# Patient Record
Sex: Female | Born: 1950 | Race: White | Hispanic: No | Smoking: Never smoker
Health system: Southern US, Community
[De-identification: ages and names within clinical notes are randomized; demographics above are authoritative.]

## PROBLEM LIST (undated history)

## (undated) DIAGNOSIS — K219 Gastro-esophageal reflux disease without esophagitis: Secondary | ICD-10-CM

## (undated) DIAGNOSIS — E559 Vitamin D deficiency, unspecified: Secondary | ICD-10-CM

## (undated) DIAGNOSIS — M810 Age-related osteoporosis without current pathological fracture: Secondary | ICD-10-CM

## (undated) DIAGNOSIS — S42209A Unspecified fracture of upper end of unspecified humerus, initial encounter for closed fracture: Secondary | ICD-10-CM

## (undated) HISTORY — PX: NO PAST SURGERIES: SHX2092

## (undated) HISTORY — PX: BREAST BIOPSY: SHX20

---

## 2001-12-07 ENCOUNTER — Encounter: Payer: Self-pay | Admitting: Emergency Medicine

## 2001-12-07 ENCOUNTER — Emergency Department (HOSPITAL_COMMUNITY): Admission: EM | Admit: 2001-12-07 | Discharge: 2001-12-07 | Payer: Self-pay | Admitting: Emergency Medicine

## 2014-01-24 ENCOUNTER — Emergency Department (HOSPITAL_BASED_OUTPATIENT_CLINIC_OR_DEPARTMENT_OTHER)
Admission: EM | Admit: 2014-01-24 | Discharge: 2014-01-24 | Disposition: A | Discharge status: Home / Self Care | Payer: No Typology Code available for payment source | Attending: Emergency Medicine | Admitting: Emergency Medicine

## 2014-01-24 ENCOUNTER — Encounter (HOSPITAL_BASED_OUTPATIENT_CLINIC_OR_DEPARTMENT_OTHER): Payer: Self-pay | Admitting: *Deleted

## 2014-01-24 ENCOUNTER — Emergency Department (HOSPITAL_BASED_OUTPATIENT_CLINIC_OR_DEPARTMENT_OTHER): Payer: No Typology Code available for payment source

## 2014-01-24 DIAGNOSIS — Y92010 Kitchen of single-family (private) house as the place of occurrence of the external cause: Secondary | ICD-10-CM | POA: Diagnosis not present

## 2014-01-24 DIAGNOSIS — Y9389 Activity, other specified: Secondary | ICD-10-CM | POA: Diagnosis not present

## 2014-01-24 DIAGNOSIS — W19XXXA Unspecified fall, initial encounter: Secondary | ICD-10-CM

## 2014-01-24 DIAGNOSIS — S6991XA Unspecified injury of right wrist, hand and finger(s), initial encounter: Secondary | ICD-10-CM | POA: Diagnosis not present

## 2014-01-24 DIAGNOSIS — W01198A Fall on same level from slipping, tripping and stumbling with subsequent striking against other object, initial encounter: Secondary | ICD-10-CM | POA: Diagnosis not present

## 2014-01-24 DIAGNOSIS — S59901A Unspecified injury of right elbow, initial encounter: Secondary | ICD-10-CM | POA: Insufficient documentation

## 2014-01-24 DIAGNOSIS — S42201A Unspecified fracture of upper end of right humerus, initial encounter for closed fracture: Secondary | ICD-10-CM

## 2014-01-24 DIAGNOSIS — S4991XA Unspecified injury of right shoulder and upper arm, initial encounter: Secondary | ICD-10-CM | POA: Diagnosis present

## 2014-01-24 DIAGNOSIS — Y998 Other external cause status: Secondary | ICD-10-CM | POA: Diagnosis not present

## 2014-01-24 DIAGNOSIS — S42291A Other displaced fracture of upper end of right humerus, initial encounter for closed fracture: Secondary | ICD-10-CM | POA: Diagnosis not present

## 2014-01-24 MED ORDER — OXYCODONE-ACETAMINOPHEN 5-325 MG PO TABS
1.0000 | ORAL_TABLET | ORAL | Status: DC | PRN
Start: 2014-01-24 — End: 2014-01-27

## 2014-01-24 NOTE — ED Notes (Signed)
C/o right shoulder pain- reports she tripped over dishwasher and landed on shoulder

## 2014-01-24 NOTE — ED Provider Notes (Signed)
CSN: [id number]     Arrival date & time 01/24/14  2031 History  This chart was scribed for Gilda Creasehristopher J. Cristiano Capri, * by Roxy Cedarhandni Bhalodia, ED Scribe. This patient was seen in room MH04/MH04 and the patient's care was started at 8:56 PM.   Chief Complaint  Patient presents with  . Shoulder Injury   Patient is a 63 y.o. female presenting with shoulder injury. The history is provided by the patient. No language interpreter was used.  Shoulder Injury   HPI Comments: Lorraine Hammond is a 63 y.o. female who presents to the Emergency Department complaining of right shoulder pain that began earlier today when patient tripped over dishwasher and landed on her shoulder. She reports associated pain to right elbow and wrist. She states that she is ambulatory and denies any other associated pain.  History reviewed. No pertinent past medical history. History reviewed. No pertinent past surgical history. No family history on file. History  Substance Use Topics  . Smoking status: Never Smoker   . Smokeless tobacco: Never Used  . Alcohol Use: Yes     Comment: 2 drinks/week   OB History    No data available     Review of Systems  Musculoskeletal: Positive for arthralgias.       Right shoulder pain.  All other systems reviewed and are negative.  Allergies  Tetracyclines & related  Home Medications   Prior to Admission medications   Not on File   Triage Vitals: BP 176/71 mmHg  Pulse 72  Temp(Src) 98.5 F (36.9 C) (Oral)  Resp 20  Ht 5\' 3"  (1.6 m)  Wt 190 lb (86.183 kg)  BMI 33.67 kg/m2  SpO2 99%  Physical Exam  Constitutional: She is oriented to person, place, and time. She appears well-developed and well-nourished. No distress.  HENT:  Head: Normocephalic and atraumatic.  Right Ear: Hearing normal.  Left Ear: Hearing normal.  Nose: Nose normal.  Mouth/Throat: Oropharynx is clear and moist and mucous membranes are normal.  Eyes: Conjunctivae and EOM are normal. Pupils are equal,  round, and reactive to light.  Neck: Normal range of motion. Neck supple.  Cardiovascular: Regular rhythm, S1 normal and S2 normal.  Exam reveals no gallop and no friction rub.   No murmur heard. Pulmonary/Chest: Effort normal and breath sounds normal. No respiratory distress. She exhibits no tenderness.  Abdominal: Soft. Normal appearance and bowel sounds are normal. There is no hepatosplenomegaly. There is no tenderness. There is no rebound, no guarding, no tenderness at McBurney's point and negative Murphy's sign. No hernia.  Musculoskeletal: Normal range of motion. She exhibits tenderness.  Proximal tenderness of the humerus. Mild tenderness to elbow and wrist. No swelling or erythema.  Neurological: She is alert and oriented to person, place, and time. She has normal strength. No cranial nerve deficit or sensory deficit. Coordination normal. GCS eye subscore is 4. GCS verbal subscore is 5. GCS motor subscore is 6.  Skin: Skin is warm, dry and intact. No rash noted. No cyanosis.  Psychiatric: She has a normal mood and affect. Her speech is normal and behavior is normal. Thought content normal.  Nursing note and vitals reviewed.  ED Course  Procedures (including critical care time)  DIAGNOSTIC STUDIES: Oxygen Saturation is 99% on RA, normal by my interpretation.    COORDINATION OF CARE: 9:00 PM- Discussed plans to order diagnostic imaging of right shoulder, elbow and wrist. Pt advised of plan for treatment and pt agrees.  Labs Review Labs Reviewed -  No data to display  Imaging Review No results found.   EKG Interpretation None     MDM   Final diagnoses:  None  proximal humerus fracture  Patient presents to the ER for evaluation of right shoulder injury after a fall. Patient tripped over her dishwasher door and fell, landing on her right side. Patient has pain in the proximal humerus area and decreased range of motion of the shoulder. She had mild pain in the elbow and wrist  area as well, no deformity noted. X-rays of these areas revealed comminuted proximal humerus fracture. Patient did not have any evidence of head injury. No headache she did not lose consciousness. No neck or back pain or tenderness. Patient had no chest wall tenderness or abdominal tenderness.  Treated with sling, analgesia, follow up with orthopedics.  I personally performed the services described in this documentation, which was scribed in my presence. The recorded information has been reviewed and is accurate.  Gilda Creasehristopher J. Brookelle Pellicane, MD 01/24/14 2157

## 2014-01-24 NOTE — Discharge Instructions (Signed)
Humerus Fracture, Treated with Immobilization  The humerus is the large bone in your upper arm. You have a broken (fractured) humerus. These fractures are easily diagnosed with X-rays.  TREATMENT   Simple fractures which will heal without disability are treated with simple immobilization. Immobilization means you will wear a cast, splint, or sling. You have a fracture which will do well with immobilization. The fracture will heal well simply by being held in a good position until it is stable enough to begin range of motion exercises. Do not take part in activities which would further injure your arm.   HOME CARE INSTRUCTIONS    Put ice on the injured area.   Put ice in a plastic bag.   Place a towel between your skin and the bag.   Leave the ice on for 15-20 minutes, 03-04 times a day.   If you have a cast:   Do not scratch the skin under the cast using sharp or pointed objects.   Check the skin around the cast every day. You may put lotion on any red or sore areas.   Keep your cast dry and clean.   If you have a splint:   Wear the splint as directed.   Keep your splint dry and clean.   You may loosen the elastic around the splint if your fingers become numb, tingle, or turn cold or blue.   If you have a sling:   Wear the sling as directed.   Do not put pressure on any part of your cast or splint until it is fully hardened.   Your cast or splint can be protected during bathing with a plastic bag. Do not lower the cast or splint into water.   Only take over-the-counter or prescription medicines for pain, discomfort, or fever as directed by your caregiver.   Do range of motion exercises as instructed by your caregiver.   Follow up as directed by your caregiver. This is very important in order to avoid permanent injury or disability and chronic pain.  SEEK IMMEDIATE MEDICAL CARE IF:    Your skin or nails in the injured arm turn blue or gray.   Your arm feels cold or numb.   You develop severe  pain in the injured arm.   You are having problems with the medicines you were given.  MAKE SURE YOU:    Understand these instructions.   Will watch your condition.   Will get help right away if you are not doing well or get worse.  Document Released: 06/11/2000 Document Revised: 05/28/2011 Document Reviewed: 04/19/2010  ExitCare Patient Information 2015 [employer]. This information is not intended to replace advice given to you by your health care provider. Make sure you discuss any questions you have with your health care provider.

## 2014-01-25 ENCOUNTER — Encounter (HOSPITAL_COMMUNITY): Payer: Self-pay | Admitting: *Deleted

## 2014-01-25 NOTE — Progress Notes (Signed)
Pt denies SOB, chest pain, and being under the care of a cardiologist. Pt denies having an EKG and chest x ray within the last year. Pt denies having a stress test, echo, and cardiac cath. Pt has no PCP at this time.

## 2014-01-26 ENCOUNTER — Encounter (HOSPITAL_COMMUNITY)
Admission: RE | Disposition: A | Discharge status: Home / Self Care | Payer: Self-pay | Source: Ambulatory Visit | Attending: Orthopedic Surgery

## 2014-01-26 ENCOUNTER — Ambulatory Visit (HOSPITAL_COMMUNITY)
Admission: RE | Admit: 2014-01-26 | Discharge: 2014-01-27 | Disposition: A | Discharge status: Home / Self Care | Payer: No Typology Code available for payment source | Source: Ambulatory Visit | Attending: Orthopedic Surgery | Admitting: Orthopedic Surgery

## 2014-01-26 ENCOUNTER — Ambulatory Visit (HOSPITAL_COMMUNITY): Payer: No Typology Code available for payment source

## 2014-01-26 ENCOUNTER — Encounter (HOSPITAL_COMMUNITY): Payer: Self-pay | Admitting: *Deleted

## 2014-01-26 ENCOUNTER — Ambulatory Visit (HOSPITAL_COMMUNITY): Payer: No Typology Code available for payment source | Admitting: Anesthesiology

## 2014-01-26 DIAGNOSIS — Z01818 Encounter for other preprocedural examination: Secondary | ICD-10-CM

## 2014-01-26 DIAGNOSIS — Y92009 Unspecified place in unspecified non-institutional (private) residence as the place of occurrence of the external cause: Secondary | ICD-10-CM | POA: Diagnosis not present

## 2014-01-26 DIAGNOSIS — Z6833 Body mass index (BMI) 33.0-33.9, adult: Secondary | ICD-10-CM | POA: Insufficient documentation

## 2014-01-26 DIAGNOSIS — E559 Vitamin D deficiency, unspecified: Secondary | ICD-10-CM | POA: Diagnosis present

## 2014-01-26 DIAGNOSIS — S42251A Displaced fracture of greater tuberosity of right humerus, initial encounter for closed fracture: Secondary | ICD-10-CM | POA: Insufficient documentation

## 2014-01-26 DIAGNOSIS — W010XXA Fall on same level from slipping, tripping and stumbling without subsequent striking against object, initial encounter: Secondary | ICD-10-CM | POA: Insufficient documentation

## 2014-01-26 DIAGNOSIS — S42351A Displaced comminuted fracture of shaft of humerus, right arm, initial encounter for closed fracture: Secondary | ICD-10-CM | POA: Diagnosis not present

## 2014-01-26 DIAGNOSIS — D62 Acute posthemorrhagic anemia: Secondary | ICD-10-CM | POA: Diagnosis not present

## 2014-01-26 DIAGNOSIS — M810 Age-related osteoporosis without current pathological fracture: Secondary | ICD-10-CM | POA: Diagnosis not present

## 2014-01-26 DIAGNOSIS — E669 Obesity, unspecified: Secondary | ICD-10-CM | POA: Insufficient documentation

## 2014-01-26 DIAGNOSIS — Z9889 Other specified postprocedural states: Secondary | ICD-10-CM

## 2014-01-26 DIAGNOSIS — D509 Iron deficiency anemia, unspecified: Secondary | ICD-10-CM | POA: Insufficient documentation

## 2014-01-26 DIAGNOSIS — E611 Iron deficiency: Secondary | ICD-10-CM | POA: Diagnosis present

## 2014-01-26 DIAGNOSIS — S42201S Unspecified fracture of upper end of right humerus, sequela: Secondary | ICD-10-CM

## 2014-01-26 DIAGNOSIS — K219 Gastro-esophageal reflux disease without esophagitis: Secondary | ICD-10-CM | POA: Insufficient documentation

## 2014-01-26 DIAGNOSIS — S42291A Other displaced fracture of upper end of right humerus, initial encounter for closed fracture: Secondary | ICD-10-CM | POA: Diagnosis not present

## 2014-01-26 DIAGNOSIS — Z419 Encounter for procedure for purposes other than remedying health state, unspecified: Secondary | ICD-10-CM

## 2014-01-26 DIAGNOSIS — S42209A Unspecified fracture of upper end of unspecified humerus, initial encounter for closed fracture: Secondary | ICD-10-CM

## 2014-01-26 HISTORY — PX: ORIF HUMERUS FRACTURE: SHX2126

## 2014-01-26 HISTORY — DX: Unspecified fracture of upper end of unspecified humerus, initial encounter for closed fracture: S42.209A

## 2014-01-26 HISTORY — DX: Gastro-esophageal reflux disease without esophagitis: K21.9

## 2014-01-26 HISTORY — DX: Age-related osteoporosis without current pathological fracture: M81.0

## 2014-01-26 HISTORY — DX: Vitamin D deficiency, unspecified: E55.9

## 2014-01-26 LAB — CBC WITH DIFFERENTIAL/PLATELET
Basophils Absolute: 0 10*3/uL (ref 0.0–0.1)
Basophils Relative: 0 % (ref 0–1)
EOS PCT: 2 % (ref 0–5)
Eosinophils Absolute: 0.2 10*3/uL (ref 0.0–0.7)
HCT: 38.4 % (ref 36.0–46.0)
HEMOGLOBIN: 12.2 g/dL (ref 12.0–15.0)
LYMPHS ABS: 2.3 10*3/uL (ref 0.7–4.0)
LYMPHS PCT: 20 % (ref 12–46)
MCH: 28.4 pg (ref 26.0–34.0)
MCHC: 31.8 g/dL (ref 30.0–36.0)
MCV: 89.5 fL (ref 78.0–100.0)
Monocytes Absolute: 0.7 10*3/uL (ref 0.1–1.0)
Monocytes Relative: 7 % (ref 3–12)
Neutro Abs: 8 10*3/uL — ABNORMAL HIGH (ref 1.7–7.7)
Neutrophils Relative %: 71 % (ref 43–77)
Platelets: 275 10*3/uL (ref 150–400)
RBC: 4.29 MIL/uL (ref 3.87–5.11)
RDW: 13.5 % (ref 11.5–15.5)
WBC: 11.2 10*3/uL — ABNORMAL HIGH (ref 4.0–10.5)

## 2014-01-26 LAB — COMPREHENSIVE METABOLIC PANEL
ALK PHOS: 72 U/L (ref 39–117)
ALT: 17 U/L (ref 0–35)
ANION GAP: 15 (ref 5–15)
AST: 18 U/L (ref 0–37)
Albumin: 4 g/dL (ref 3.5–5.2)
BUN: 17 mg/dL (ref 6–23)
CALCIUM: 9.4 mg/dL (ref 8.4–10.5)
CO2: 23 meq/L (ref 19–32)
Chloride: 100 mEq/L (ref 96–112)
Creatinine, Ser: 0.7 mg/dL (ref 0.50–1.10)
GLUCOSE: 100 mg/dL — AB (ref 70–99)
POTASSIUM: 4.4 meq/L (ref 3.7–5.3)
Sodium: 138 mEq/L (ref 137–147)
Total Bilirubin: 0.5 mg/dL (ref 0.3–1.2)
Total Protein: 8 g/dL (ref 6.0–8.3)

## 2014-01-26 LAB — IRON AND TIBC
IRON: 37 ug/dL — AB (ref 42–135)
Saturation Ratios: 11 % — ABNORMAL LOW (ref 20–55)
TIBC: 347 ug/dL (ref 250–470)
UIBC: 310 ug/dL (ref 125–400)

## 2014-01-26 LAB — TSH: TSH: 4.83 u[IU]/mL — ABNORMAL HIGH (ref 0.350–4.500)

## 2014-01-26 LAB — RETICULOCYTES
RBC.: 4.29 MIL/uL (ref 3.87–5.11)
RETIC CT PCT: 1.7 % (ref 0.4–3.1)
Retic Count, Absolute: 72.9 10*3/uL (ref 19.0–186.0)

## 2014-01-26 LAB — MAGNESIUM: MAGNESIUM: 2.1 mg/dL (ref 1.5–2.5)

## 2014-01-26 LAB — PREALBUMIN: PREALBUMIN: 21.1 mg/dL (ref 17.0–34.0)

## 2014-01-26 LAB — PROTIME-INR
INR: 2.37 — ABNORMAL HIGH (ref 0.00–1.49)
INR: 1.02 (ref 0.00–1.49)
Prothrombin Time: 26.1 seconds — ABNORMAL HIGH (ref 11.6–15.2)
Prothrombin Time: 13.5 seconds (ref 11.6–15.2)

## 2014-01-26 LAB — VITAMIN B12: VITAMIN B 12: 350 pg/mL (ref 211–911)

## 2014-01-26 LAB — PHOSPHORUS: PHOSPHORUS: 3.1 mg/dL (ref 2.3–4.6)

## 2014-01-26 LAB — TRANSFERRIN: TRANSFERRIN: 298 mg/dL (ref 200–360)

## 2014-01-26 LAB — CALCIUM, IONIZED: Calcium, Ion: 1.25 mmol/L (ref 1.13–1.30)

## 2014-01-26 LAB — FERRITIN: Ferritin: 48 ng/mL (ref 10–291)

## 2014-01-26 LAB — APTT: aPTT: >200 seconds (ref 24–37)

## 2014-01-26 LAB — FOLATE: Folate: >20 ng/mL

## 2014-01-26 SURGERY — OPEN REDUCTION INTERNAL FIXATION (ORIF) PROXIMAL HUMERUS FRACTURE
Anesthesia: Regional | Laterality: Right

## 2014-01-26 MED ORDER — BUPIVACAINE-EPINEPHRINE (PF) 0.5% -1:200000 IJ SOLN
INTRAMUSCULAR | Status: DC | PRN
  Administered 2014-01-26: 30 mL via PERINEURAL

## 2014-01-26 MED ORDER — ARTIFICIAL TEARS OP OINT
TOPICAL_OINTMENT | OPHTHALMIC | Status: AC
  Filled 2014-01-26: qty 3.5

## 2014-01-26 MED ORDER — MIDAZOLAM HCL 2 MG/2ML IJ SOLN
INTRAMUSCULAR | Status: AC
  Administered 2014-01-26: 2 mg via INTRAVENOUS
  Filled 2014-01-26: qty 2

## 2014-01-26 MED ORDER — ARTIFICIAL TEARS OP OINT
TOPICAL_OINTMENT | OPHTHALMIC | Status: DC | PRN
  Administered 2014-01-26: 1 via OPHTHALMIC

## 2014-01-26 MED ORDER — DOCUSATE SODIUM 100 MG PO CAPS
100.0000 mg | ORAL_CAPSULE | Freq: Two times a day (BID) | ORAL | Status: DC
  Administered 2014-01-26 – 2014-01-27 (×2): 100 mg via ORAL
  Filled 2014-01-26 (×3): qty 1

## 2014-01-26 MED ORDER — OXYCODONE-ACETAMINOPHEN 5-325 MG PO TABS
1.0000 | ORAL_TABLET | Freq: Four times a day (QID) | ORAL | Status: DC | PRN
  Administered 2014-01-27: 2 via ORAL
  Filled 2014-01-26: qty 2

## 2014-01-26 MED ORDER — MAGNESIUM HYDROXIDE 400 MG/5ML PO SUSP: 30.0000 mL | Freq: Every day | ORAL | Status: DC | PRN

## 2014-01-26 MED ORDER — VECURONIUM BROMIDE 10 MG IV SOLR
INTRAVENOUS | Status: AC
  Filled 2014-01-26: qty 10

## 2014-01-26 MED ORDER — ONDANSETRON HCL 4 MG/2ML IJ SOLN: 4.0000 mg | Freq: Four times a day (QID) | INTRAMUSCULAR | Status: DC | PRN

## 2014-01-26 MED ORDER — PHENYLEPHRINE HCL 10 MG/ML IJ SOLN
10.0000 mg | INTRAVENOUS | Status: DC | PRN
  Administered 2014-01-26: 20 ug/min via INTRAVENOUS

## 2014-01-26 MED ORDER — HYDROMORPHONE HCL 1 MG/ML IJ SOLN: 0.2500 mg | INTRAMUSCULAR | Status: DC | PRN

## 2014-01-26 MED ORDER — MIDAZOLAM HCL 2 MG/2ML IJ SOLN
1.0000 mg | INTRAMUSCULAR | Status: DC | PRN
  Administered 2014-01-26: 2 mg via INTRAVENOUS

## 2014-01-26 MED ORDER — ONDANSETRON HCL 4 MG/2ML IJ SOLN
INTRAMUSCULAR | Status: DC | PRN
  Administered 2014-01-26: 4 mg via INTRAVENOUS

## 2014-01-26 MED ORDER — METOCLOPRAMIDE HCL 10 MG PO TABS: 5.0000 mg | ORAL_TABLET | Freq: Three times a day (TID) | ORAL | Status: DC | PRN

## 2014-01-26 MED ORDER — FENTANYL CITRATE 0.05 MG/ML IJ SOLN
50.0000 ug | INTRAMUSCULAR | Status: DC | PRN
  Administered 2014-01-26: 100 ug via INTRAVENOUS

## 2014-01-26 MED ORDER — ENOXAPARIN SODIUM 40 MG/0.4ML ~~LOC~~ SOLN
40.0000 mg | SUBCUTANEOUS | Status: DC
  Administered 2014-01-27: 40 mg via SUBCUTANEOUS
  Filled 2014-01-26 (×2): qty 0.4

## 2014-01-26 MED ORDER — ACETAMINOPHEN 500 MG PO TABS
1000.0000 mg | ORAL_TABLET | Freq: Once | ORAL | Status: DC
Start: 2014-01-26 — End: 2014-01-26

## 2014-01-26 MED ORDER — MAGNESIUM CITRATE PO SOLN: 1.0000 | Freq: Once | ORAL | Status: AC | PRN

## 2014-01-26 MED ORDER — DIPHENHYDRAMINE HCL 12.5 MG/5ML PO ELIX: 12.5000 mg | ORAL_SOLUTION | ORAL | Status: DC | PRN

## 2014-01-26 MED ORDER — ACETAMINOPHEN 10 MG/ML IV SOLN
1000.0000 mg | INTRAVENOUS | Status: AC
  Administered 2014-01-26: 1000 mg via INTRAVENOUS
  Filled 2014-01-26: qty 100

## 2014-01-26 MED ORDER — ONDANSETRON HCL 4 MG PO TABS: 4.0000 mg | ORAL_TABLET | Freq: Four times a day (QID) | ORAL | Status: DC | PRN

## 2014-01-26 MED ORDER — MIDAZOLAM HCL 2 MG/2ML IJ SOLN
INTRAMUSCULAR | Status: AC
  Filled 2014-01-26: qty 2

## 2014-01-26 MED ORDER — GLYCOPYRROLATE 0.2 MG/ML IJ SOLN
INTRAMUSCULAR | Status: DC | PRN
Start: 2014-01-26 — End: 2014-01-26
  Administered 2014-01-26: 0.6 mg via INTRAVENOUS

## 2014-01-26 MED ORDER — VECURONIUM BROMIDE 10 MG IV SOLR
INTRAVENOUS | Status: DC | PRN
Start: 2014-01-26 — End: 2014-01-26
  Administered 2014-01-26 (×2): 1 mg via INTRAVENOUS

## 2014-01-26 MED ORDER — GLYCOPYRROLATE 0.2 MG/ML IJ SOLN
INTRAMUSCULAR | Status: AC
  Filled 2014-01-26: qty 3

## 2014-01-26 MED ORDER — ONDANSETRON HCL 4 MG/2ML IJ SOLN
INTRAMUSCULAR | Status: AC
  Filled 2014-01-26: qty 2

## 2014-01-26 MED ORDER — SODIUM CHLORIDE 0.9 % IJ SOLN
INTRAMUSCULAR | Status: AC
  Filled 2014-01-26: qty 10

## 2014-01-26 MED ORDER — ACETAMINOPHEN 325 MG PO TABS: 650.0000 mg | ORAL_TABLET | Freq: Four times a day (QID) | ORAL | Status: DC | PRN

## 2014-01-26 MED ORDER — POTASSIUM CHLORIDE IN NACL 20-0.9 MEQ/L-% IV SOLN
INTRAVENOUS | Status: DC
  Administered 2014-01-26: 23:00:00 via INTRAVENOUS
  Filled 2014-01-26 (×2): qty 1000

## 2014-01-26 MED ORDER — LACTATED RINGERS IV SOLN
INTRAVENOUS | Status: DC | PRN
  Administered 2014-01-26 (×2): via INTRAVENOUS

## 2014-01-26 MED ORDER — ALUM & MAG HYDROXIDE-SIMETH 200-200-20 MG/5ML PO SUSP: 30.0000 mL | ORAL | Status: DC | PRN

## 2014-01-26 MED ORDER — PROPOFOL 10 MG/ML IV BOLUS
INTRAVENOUS | Status: DC | PRN
  Administered 2014-01-26: 130 mg via INTRAVENOUS

## 2014-01-26 MED ORDER — BISACODYL 5 MG PO TBEC: 5.0000 mg | DELAYED_RELEASE_TABLET | Freq: Every day | ORAL | Status: DC | PRN

## 2014-01-26 MED ORDER — MENTHOL 3 MG MT LOZG: 1.0000 | LOZENGE | OROMUCOSAL | Status: DC | PRN

## 2014-01-26 MED ORDER — DEXAMETHASONE SODIUM PHOSPHATE 4 MG/ML IJ SOLN
INTRAMUSCULAR | Status: DC | PRN
  Administered 2014-01-26: 8 mg via INTRAVENOUS

## 2014-01-26 MED ORDER — FENTANYL CITRATE 0.05 MG/ML IJ SOLN
INTRAMUSCULAR | Status: AC
  Filled 2014-01-26: qty 5

## 2014-01-26 MED ORDER — MIDAZOLAM HCL 5 MG/5ML IJ SOLN
INTRAMUSCULAR | Status: DC | PRN
  Administered 2014-01-26: 1 mg via INTRAVENOUS

## 2014-01-26 MED ORDER — METHOCARBAMOL 1000 MG/10ML IJ SOLN
1000.0000 mg | Freq: Four times a day (QID) | INTRAVENOUS | Status: DC | PRN
  Filled 2014-01-26: qty 10

## 2014-01-26 MED ORDER — LIDOCAINE HCL (CARDIAC) 20 MG/ML IV SOLN
INTRAVENOUS | Status: AC
  Filled 2014-01-26: qty 5

## 2014-01-26 MED ORDER — LACTATED RINGERS IV SOLN
INTRAVENOUS | Status: DC
  Administered 2014-01-26: 11:00:00 via INTRAVENOUS

## 2014-01-26 MED ORDER — FENTANYL CITRATE 0.05 MG/ML IJ SOLN
INTRAMUSCULAR | Status: DC | PRN
  Administered 2014-01-26: 25 ug via INTRAVENOUS
  Administered 2014-01-26: 50 ug via INTRAVENOUS
  Administered 2014-01-26: 100 ug via INTRAVENOUS

## 2014-01-26 MED ORDER — OXYCODONE HCL 5 MG PO TABS: 5.0000 mg | ORAL_TABLET | Freq: Once | ORAL | Status: DC | PRN

## 2014-01-26 MED ORDER — LORATADINE 10 MG PO TABS
10.0000 mg | ORAL_TABLET | Freq: Every day | ORAL | Status: DC
  Administered 2014-01-26 – 2014-01-27 (×2): 10 mg via ORAL
  Filled 2014-01-26 (×2): qty 1

## 2014-01-26 MED ORDER — FENTANYL CITRATE 0.05 MG/ML IJ SOLN
INTRAMUSCULAR | Status: AC
  Administered 2014-01-26: 100 ug via INTRAVENOUS
  Filled 2014-01-26: qty 2

## 2014-01-26 MED ORDER — CEFAZOLIN SODIUM-DEXTROSE 2-3 GM-% IV SOLR
2.0000 g | Freq: Four times a day (QID) | INTRAVENOUS | Status: AC
  Administered 2014-01-26 – 2014-01-27 (×3): 2 g via INTRAVENOUS
  Filled 2014-01-26 (×4): qty 50

## 2014-01-26 MED ORDER — ROCURONIUM BROMIDE 100 MG/10ML IV SOLN
INTRAVENOUS | Status: DC | PRN
  Administered 2014-01-26: 50 mg via INTRAVENOUS

## 2014-01-26 MED ORDER — OXYCODONE HCL 5 MG PO TABS
5.0000 mg | ORAL_TABLET | ORAL | Status: DC | PRN
  Administered 2014-01-27: 5 mg via ORAL
  Administered 2014-01-27: 10 mg via ORAL
  Filled 2014-01-26: qty 2
  Filled 2014-01-26: qty 1

## 2014-01-26 MED ORDER — METOCLOPRAMIDE HCL 5 MG/ML IJ SOLN: 5.0000 mg | Freq: Three times a day (TID) | INTRAMUSCULAR | Status: DC | PRN

## 2014-01-26 MED ORDER — ACETAMINOPHEN 650 MG RE SUPP: 650.0000 mg | Freq: Four times a day (QID) | RECTAL | Status: DC | PRN

## 2014-01-26 MED ORDER — LIDOCAINE HCL (CARDIAC) 20 MG/ML IV SOLN
INTRAVENOUS | Status: DC | PRN
  Administered 2014-01-26: 100 mg via INTRAVENOUS

## 2014-01-26 MED ORDER — DEXTROSE 5 % IV SOLN
INTRAVENOUS | Status: DC | PRN
  Administered 2014-01-26: 13:00:00 via INTRAVENOUS

## 2014-01-26 MED ORDER — PHENOL 1.4 % MT LIQD: 1.0000 | OROMUCOSAL | Status: DC | PRN

## 2014-01-26 MED ORDER — ROCURONIUM BROMIDE 50 MG/5ML IV SOLN
INTRAVENOUS | Status: AC
  Filled 2014-01-26: qty 1

## 2014-01-26 MED ORDER — 0.9 % SODIUM CHLORIDE (POUR BTL) OPTIME
TOPICAL | Status: DC | PRN
  Administered 2014-01-26: 1000 mL

## 2014-01-26 MED ORDER — HYDROMORPHONE HCL 1 MG/ML IJ SOLN: 0.5000 mg | INTRAMUSCULAR | Status: DC | PRN

## 2014-01-26 MED ORDER — PROPOFOL 10 MG/ML IV BOLUS
INTRAVENOUS | Status: AC
  Filled 2014-01-26: qty 20

## 2014-01-26 MED ORDER — CHLORHEXIDINE GLUCONATE 4 % EX LIQD: 60.0000 mL | Freq: Once | CUTANEOUS | Status: DC

## 2014-01-26 MED ORDER — NEOSTIGMINE METHYLSULFATE 10 MG/10ML IV SOLN
INTRAVENOUS | Status: DC | PRN
  Administered 2014-01-26: 4 mg via INTRAVENOUS

## 2014-01-26 MED ORDER — DEXAMETHASONE SODIUM PHOSPHATE 4 MG/ML IJ SOLN
INTRAMUSCULAR | Status: AC
  Filled 2014-01-26: qty 2

## 2014-01-26 MED ORDER — OXYCODONE HCL 5 MG/5ML PO SOLN: 5.0000 mg | Freq: Once | ORAL | Status: DC | PRN

## 2014-01-26 MED ORDER — CEFAZOLIN SODIUM-DEXTROSE 2-3 GM-% IV SOLR
2.0000 g | INTRAVENOUS | Status: AC
  Administered 2014-01-26: 2 g via INTRAVENOUS
  Filled 2014-01-26: qty 50

## 2014-01-26 MED ORDER — METHOCARBAMOL 500 MG PO TABS
500.0000 mg | ORAL_TABLET | Freq: Four times a day (QID) | ORAL | Status: DC | PRN
  Administered 2014-01-27: 500 mg via ORAL
  Administered 2014-01-27: 1000 mg via ORAL
  Filled 2014-01-26: qty 2
  Filled 2014-01-26: qty 1

## 2014-01-26 MED ORDER — PANTOPRAZOLE SODIUM 40 MG PO TBEC
40.0000 mg | DELAYED_RELEASE_TABLET | Freq: Every day | ORAL | Status: DC
  Administered 2014-01-26 – 2014-01-27 (×2): 40 mg via ORAL
  Filled 2014-01-26 (×2): qty 1

## 2014-01-26 SURGICAL SUPPLY — 86 items
APL SKNCLS STERI-STRIP NONHPOA (GAUZE/BANDAGES/DRESSINGS) ×1
BANDAGE ELASTIC 3 VELCRO ST LF (GAUZE/BANDAGES/DRESSINGS) ×2 IMPLANT
BANDAGE ELASTIC 4 VELCRO ST LF (GAUZE/BANDAGES/DRESSINGS) ×2 IMPLANT
BENZOIN TINCTURE PRP APPL 2/3 (GAUZE/BANDAGES/DRESSINGS) ×4 IMPLANT
BIT DRILL 4 LONG FAST STEP (BIT) ×2 IMPLANT
BIT DRILL 4 SHORT FAST STEP (BIT) ×2 IMPLANT
BNDG GAUZE ELAST 4 BULKY (GAUZE/BANDAGES/DRESSINGS) ×4 IMPLANT
BONE CANC CHIPS 20CC PCAN1/4 (Bone Implant) ×3 IMPLANT
BRUSH SCRUB DISP (MISCELLANEOUS) ×4 IMPLANT
CHIPS CANC BONE 20CC PCAN1/4 (Bone Implant) ×1 IMPLANT
CLOSURE STERI-STRIP 1/2X4 (GAUZE/BANDAGES/DRESSINGS) ×1
CLSR STERI-STRIP ANTIMIC 1/2X4 (GAUZE/BANDAGES/DRESSINGS) ×1 IMPLANT
COVER SURGICAL LIGHT HANDLE (MISCELLANEOUS) ×6 IMPLANT
DRAPE C-ARM 42X72 X-RAY (DRAPES) ×3 IMPLANT
DRAPE C-ARMOR (DRAPES) ×3 IMPLANT
DRAPE IMP U-DRAPE 54X76 (DRAPES) ×3 IMPLANT
DRAPE INCISE IOBAN 66X45 STRL (DRAPES) ×2 IMPLANT
DRAPE ORTHO SPLIT 77X108 STRL (DRAPES) ×3
DRAPE SURG 17X11 SM STRL (DRAPES) ×2 IMPLANT
DRAPE SURG ORHT 6 SPLT 77X108 (DRAPES) ×2 IMPLANT
DRAPE U-SHAPE 47X51 STRL (DRAPES) ×6 IMPLANT
DRILL BIT 2.8MM DB28 (MISCELLANEOUS) ×2 IMPLANT
DRSG ADAPTIC 3X8 NADH LF (GAUZE/BANDAGES/DRESSINGS) ×3 IMPLANT
DRSG MEPILEX BORDER 4X4 (GAUZE/BANDAGES/DRESSINGS) ×2 IMPLANT
DRSG PAD ABDOMINAL 8X10 ST (GAUZE/BANDAGES/DRESSINGS) ×3 IMPLANT
ELECT REM PT RETURN 9FT ADLT (ELECTROSURGICAL) ×3
ELECTRODE REM PT RTRN 9FT ADLT (ELECTROSURGICAL) ×1 IMPLANT
EVACUATOR 1/8 PVC DRAIN (DRAIN) IMPLANT
GAUZE SPONGE 4X4 12PLY STRL (GAUZE/BANDAGES/DRESSINGS) ×2 IMPLANT
GLOVE BIO SURGEON STRL SZ7.5 (GLOVE) ×3 IMPLANT
GLOVE BIO SURGEON STRL SZ8 (GLOVE) ×3 IMPLANT
GLOVE BIOGEL PI IND STRL 7.5 (GLOVE) ×1 IMPLANT
GLOVE BIOGEL PI IND STRL 8 (GLOVE) ×1 IMPLANT
GLOVE BIOGEL PI INDICATOR 7.5 (GLOVE) ×2
GLOVE BIOGEL PI INDICATOR 8 (GLOVE) ×2
GOWN STRL REUS W/ TWL LRG LVL3 (GOWN DISPOSABLE) ×2 IMPLANT
GOWN STRL REUS W/ TWL XL LVL3 (GOWN DISPOSABLE) ×1 IMPLANT
GOWN STRL REUS W/TWL 2XL LVL3 (GOWN DISPOSABLE) ×1 IMPLANT
GOWN STRL REUS W/TWL LRG LVL3 (GOWN DISPOSABLE) ×6
GOWN STRL REUS W/TWL XL LVL3 (GOWN DISPOSABLE) ×3
GRAFT BNE CANC CHIPS 1-8 20CC (Bone Implant) IMPLANT
KIT BASIN OR (CUSTOM PROCEDURE TRAY) ×3 IMPLANT
KIT ROOM TURNOVER OR (KITS) ×3 IMPLANT
LOOP VESSEL MAXI BLUE (MISCELLANEOUS) IMPLANT
MANIFOLD NEPTUNE II (INSTRUMENTS) ×1 IMPLANT
NS IRRIG 1000ML POUR BTL (IV SOLUTION) ×3 IMPLANT
PACK TOTAL JOINT (CUSTOM PROCEDURE TRAY) ×3 IMPLANT
PACK UNIVERSAL I (CUSTOM PROCEDURE TRAY) ×3 IMPLANT
PAD ARMBOARD 7.5X6 YLW CONV (MISCELLANEOUS) ×6 IMPLANT
PEG STND 4.0X30MM (Orthopedic Implant) ×3 IMPLANT
PEG STND 4.0X35MM (Orthopedic Implant) ×3 IMPLANT
PEG STND 4.0X37.5MM (Orthopedic Implant) ×3 IMPLANT
PEG STND 4.0X40MM (Orthopedic Implant) ×3 IMPLANT
PEG STND 4.0X42.5MM (Orthopedic Implant) ×3 IMPLANT
PEG STND 4.0X45.0MM (Orthopedic Implant) ×3 IMPLANT
PEGSTD 4.0X30MM (Orthopedic Implant) IMPLANT
PEGSTD 4.0X35MM (Orthopedic Implant) IMPLANT
PEGSTD 4.0X37.5MM (Orthopedic Implant) IMPLANT
PEGSTD 4.0X40MM (Orthopedic Implant) IMPLANT
PEGSTD 4.0X42.5MM (Orthopedic Implant) IMPLANT
PEGSTD 4.0X45.0MM (Orthopedic Implant) IMPLANT
PIN GUIDE SHOULDER 2.0MM (PIN) ×6 IMPLANT
PLATE SHOULDER S3 3HOLE RT (Plate) ×2 IMPLANT
SCREW LOCK 90D ANGLED 3.8X24 (Screw) ×2 IMPLANT
SCREW LOCK 90D ANGLED 3.8X28 (Screw) ×2 IMPLANT
SCREW MULTIDIR SS 3.8X28 HUMRL (Screw) ×2 IMPLANT
SPONGE GAUZE 4X4 12PLY STER LF (GAUZE/BANDAGES/DRESSINGS) ×2 IMPLANT
SPONGE LAP 18X18 X RAY DECT (DISPOSABLE) ×2 IMPLANT
STAPLER VISISTAT 35W (STAPLE) ×3 IMPLANT
STOCKINETTE IMPERVIOUS LG (DRAPES) ×3 IMPLANT
SUCTION FRAZIER TIP 10 FR DISP (SUCTIONS) ×3 IMPLANT
SUT ETHIBOND 5 LR DA (SUTURE) ×1 IMPLANT
SUT FIBERWIRE #2 38 T-5 BLUE (SUTURE) ×12
SUT PDS AB 2-0 CT1 27 (SUTURE) IMPLANT
SUT VIC AB 0 CT1 27 (SUTURE) ×6
SUT VIC AB 0 CT1 27XBRD ANBCTR (SUTURE) ×2 IMPLANT
SUT VIC AB 2-0 CT1 27 (SUTURE) ×6
SUT VIC AB 2-0 CT1 TAPERPNT 27 (SUTURE) ×2 IMPLANT
SUT VIC AB 2-0 CT3 27 (SUTURE) IMPLANT
SUTURE FIBERWR #2 38 T-5 BLUE (SUTURE) IMPLANT
SYR 5ML LL (SYRINGE) IMPLANT
TOWEL OR 17X24 6PK STRL BLUE (TOWEL DISPOSABLE) ×3 IMPLANT
TOWEL OR 17X26 10 PK STRL BLUE (TOWEL DISPOSABLE) ×6 IMPLANT
TRAY FOLEY CATH 16FRSI W/METER (SET/KITS/TRAYS/PACK) IMPLANT
WATER STERILE IRR 1000ML POUR (IV SOLUTION) ×1 IMPLANT
YANKAUER SUCT BULB TIP NO VENT (SUCTIONS) ×2 IMPLANT

## 2014-01-26 NOTE — Brief Op Note (Signed)
01/26/2014  4:44 PM  PATIENT:  Ebony Haileresa Couts  63 y.o. female  PRE-OPERATIVE DIAGNOSIS:  RIGHT THREE PART PROXIMAL FRACTURE  POST-OPERATIVE DIAGNOSIS:  RIGHT THREE PART PROXIMAL FRACTURE  PROCEDURE:  Procedure(s): OPEN REDUCTION INTERNAL FIXATION (ORIF) RIGHT THREE PART PROXIMAL FRACTURE  SURGEON:  Surgeon(s) and Role:    * Budd PalmerMichael H Marifer Hurd, MD - Primary  PHYSICIAN ASSISTANT: Montez MoritaKeith Paul, PA-C  ANESTHESIA:   general  I/O:  Total I/O In: 1850 [I.V.:1850] Out: 200 [Blood:200]  TOURNIQUET:    DICTATION: .Other Dictation: Dictation Number [id number]

## 2014-01-26 NOTE — Transfer of Care (Signed)
Immediate Anesthesia Transfer of Care Note  Patient: Lorraine Hammond  Procedure(s) Performed: Procedure(s): OPEN REDUCTION INTERNAL FIXATION (ORIF) PROXIMAL HUMERUS FRACTURE (Right)  Patient Location: PACU  Anesthesia Type:General  Level of Consciousness: sedated and confused  Airway & Oxygen Therapy: Patient Spontanous Breathing, Patient connected to nasal cannula oxygen and changed to FM O2  Post-op Assessment: Report given to PACU RN, Post -op Vital signs reviewed and stable and Patient moving all extremities  Post vital signs: Reviewed and stable  Complications: No apparent anesthesia complications

## 2014-01-26 NOTE — Anesthesia Postprocedure Evaluation (Signed)
  Anesthesia Post-op Note  Patient: Lorraine Hammond  Procedure(s) Performed: Procedure(s): OPEN REDUCTION INTERNAL FIXATION (ORIF) PROXIMAL HUMERUS FRACTURE (Right)  Patient Location: PACU  Anesthesia Type:General and GA combined with regional for post-op pain  Level of Consciousness: awake, alert  and oriented  Airway and Oxygen Therapy: Patient Spontanous Breathing and Patient connected to nasal cannula oxygen  Post-op Pain: none  Post-op Assessment: Post-op Vital signs reviewed, Patient's Cardiovascular Status Stable, Respiratory Function Stable, Patent Airway, No signs of Nausea or vomiting and Pain level controlled  Post-op Vital Signs: stable  Last Vitals:  Filed Vitals:   01/26/14 2007  BP: 126/66  Pulse: 94  Temp: 37.2 C  Resp: 17    Complications: No apparent anesthesia complications
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
## 2014-01-26 NOTE — Anesthesia Preprocedure Evaluation (Addendum)
Anesthesia Evaluation  Patient identified by MRN, date of birth, ID band Patient awake    Reviewed: Allergy & Precautions, H&P , NPO status , Patient's Chart, lab work & pertinent test results  Airway Mallampati: II  TM Distance: >3 FB Neck ROM: full    Dental  (+) Dental Advisory Given, Missing   Pulmonary neg pulmonary ROS,          Cardiovascular negative cardio ROS      Neuro/Psych    GI/Hepatic GERD-  Controlled and Medicated,  Endo/Other  obese  Renal/GU      Musculoskeletal   Abdominal   Peds  Hematology   Anesthesia Other Findings One tooth is broken but does not hurt  Reproductive/Obstetrics                           Anesthesia Physical Anesthesia Plan  ASA: II  Anesthesia Plan: General and Regional   Post-op Pain Management: MAC Combined w/ Regional for Post-op pain   Induction: Intravenous  Airway Management Planned: Oral ETT  Additional Equipment:   Intra-op Plan:   Post-operative Plan: Extubation in OR  Informed Consent: I have reviewed the patients History and Physical, chart, labs and discussed the procedure including the risks, benefits and alternatives for the proposed anesthesia with the patient or authorized representative who has indicated his/her understanding and acceptance.     Plan Discussed with: CRNA, Anesthesiologist and Surgeon  Anesthesia Plan Comments:         Anesthesia Quick Evaluation

## 2014-01-26 NOTE — Anesthesia Procedure Notes (Addendum)
Anesthesia Regional Block:  Interscalene brachial plexus block  Pre-Anesthetic Checklist: ,, timeout performed, Correct Patient, Correct Site, Correct Laterality, Correct Procedure, Correct Position, site marked, Risks and benefits discussed,  Surgical consent,  Pre-op evaluation,  At surgeon's request and post-op pain management  Laterality: Right  Prep: chloraprep       Needles:  Injection technique: Single-shot  Needle Type: Echogenic Stimulator Needle     Needle Length: 5cm 5 cm Needle Gauge: 22 and 22 G    Additional Needles:  Procedures: ultrasound guided (picture in chart) and nerve stimulator Interscalene brachial plexus block  Nerve Stimulator or Paresthesia:  Response: biceps flexion, 0.45 mA,   Additional Responses:   Narrative:  Start time: 01/26/2014 12:11 PM End time: 01/26/2014 12:25 PM Injection made incrementally with aspirations every 5 mL.  Performed by: Personally   Additional Notes: Functioning IV was confirmed and monitors were applied.  A 50mm 22ga Arrow echogenic stimulator needle was used. Sterile prep and drape,hand hygiene and sterile gloves were used.  Negative aspiration and negative test dose prior to incremental administration of local anesthetic. The patient tolerated the procedure well.  Ultrasound guidance: relevent anatomy identified, needle position confirmed, local anesthetic spread visualized around nerve(s), vascular puncture avoided.  Image printed for medical record.    Procedure Name: Intubation Date/Time: 01/26/2014 1:11 PM Performed by: Darcey NoraJAMES, Cidney Kirkwood B Pre-anesthesia Checklist: Patient identified, Emergency Drugs available, Suction available and Patient being monitored Patient Re-evaluated:Patient Re-evaluated prior to inductionOxygen Delivery Method: Circle system utilized Preoxygenation: Pre-oxygenation with 100% oxygen Intubation Type: IV induction Ventilation: Mask ventilation without difficulty Laryngoscope Size: Mac and  3 Grade View: Grade II Tube type: Oral Tube size: 7.5 mm Number of attempts: 1 Airway Equipment and Method: Stylet Placement Confirmation: ETT inserted through vocal cords under direct vision,  breath sounds checked- equal and bilateral and positive ETCO2 Secured at: 21 (cm at teeth) cm Tube secured with: Tape Dental Injury: Teeth and Oropharynx as per pre-operative assessment

## 2014-01-26 NOTE — Progress Notes (Signed)
Anesthesia sign out requested to Dr Noreene LarssonJoslin

## 2014-01-27 ENCOUNTER — Ambulatory Visit (HOSPITAL_COMMUNITY): Payer: No Typology Code available for payment source

## 2014-01-27 ENCOUNTER — Encounter (HOSPITAL_COMMUNITY): Payer: Self-pay | Admitting: Orthopedic Surgery

## 2014-01-27 DIAGNOSIS — M810 Age-related osteoporosis without current pathological fracture: Secondary | ICD-10-CM | POA: Diagnosis present

## 2014-01-27 DIAGNOSIS — S42251A Displaced fracture of greater tuberosity of right humerus, initial encounter for closed fracture: Secondary | ICD-10-CM | POA: Diagnosis not present

## 2014-01-27 DIAGNOSIS — E611 Iron deficiency: Secondary | ICD-10-CM | POA: Diagnosis present

## 2014-01-27 DIAGNOSIS — K219 Gastro-esophageal reflux disease without esophagitis: Secondary | ICD-10-CM | POA: Diagnosis present

## 2014-01-27 DIAGNOSIS — E559 Vitamin D deficiency, unspecified: Secondary | ICD-10-CM

## 2014-01-27 HISTORY — DX: Vitamin D deficiency, unspecified: E55.9

## 2014-01-27 HISTORY — DX: Age-related osteoporosis without current pathological fracture: M81.0

## 2014-01-27 LAB — BASIC METABOLIC PANEL
Anion gap: 12 (ref 5–15)
BUN: 11 mg/dL (ref 6–23)
CALCIUM: 8.8 mg/dL (ref 8.4–10.5)
CO2: 26 mEq/L (ref 19–32)
Chloride: 101 mEq/L (ref 96–112)
Creatinine, Ser: 0.68 mg/dL (ref 0.50–1.10)
GFR calc Af Amer: >90 mL/min (ref >90)
Glucose, Bld: 105 mg/dL — ABNORMAL HIGH (ref 70–99)
POTASSIUM: 4.4 meq/L (ref 3.7–5.3)
Sodium: 139 mEq/L (ref 137–147)

## 2014-01-27 LAB — CBC
HCT: 33.1 % — ABNORMAL LOW (ref 36.0–46.0)
Hemoglobin: 10.5 g/dL — ABNORMAL LOW (ref 12.0–15.0)
MCH: 27.7 pg (ref 26.0–34.0)
MCHC: 31.7 g/dL (ref 30.0–36.0)
MCV: 87.3 fL (ref 78.0–100.0)
Platelets: 274 10*3/uL (ref 150–400)
RBC: 3.79 MIL/uL — ABNORMAL LOW (ref 3.87–5.11)
RDW: 13.3 % (ref 11.5–15.5)
WBC: 9.6 10*3/uL (ref 4.0–10.5)

## 2014-01-27 LAB — PTH, INTACT AND CALCIUM
Calcium, Total (PTH): 9.2 mg/dL (ref 8.4–10.5)
PTH: 48 pg/mL (ref 14–64)

## 2014-01-27 LAB — VITAMIN D 25 HYDROXY (VIT D DEFICIENCY, FRACTURES): VIT D 25 HYDROXY: 12 ng/mL — AB (ref 30–89)

## 2014-01-27 MED ORDER — ADULT MULTIVITAMIN W/MINERALS CH
1.0000 | ORAL_TABLET | Freq: Every day | ORAL | Status: AC
Start: 2014-01-27 — End: ?

## 2014-01-27 MED ORDER — METHOCARBAMOL 500 MG PO TABS: 500.0000 mg | ORAL_TABLET | Freq: Four times a day (QID) | ORAL | Status: AC | PRN

## 2014-01-27 MED ORDER — FERROUS SULFATE 325 (65 FE) MG PO TABS
325.0000 mg | ORAL_TABLET | Freq: Three times a day (TID) | ORAL | Status: DC
  Filled 2014-01-27 (×3): qty 1

## 2014-01-27 MED ORDER — ADULT MULTIVITAMIN W/MINERALS CH
1.0000 | ORAL_TABLET | Freq: Every day | ORAL | Status: DC
  Filled 2014-01-27: qty 1

## 2014-01-27 MED ORDER — VITAMIN D (ERGOCALCIFEROL) 1.25 MG (50000 UNIT) PO CAPS
50000.0000 [IU] | ORAL_CAPSULE | ORAL | Status: DC
  Filled 2014-01-27: qty 1

## 2014-01-27 MED ORDER — DSS 100 MG PO CAPS: 100.0000 mg | ORAL_CAPSULE | Freq: Two times a day (BID) | ORAL | Status: AC

## 2014-01-27 MED ORDER — OXYCODONE-ACETAMINOPHEN 5-325 MG PO TABS: 1.0000 | ORAL_TABLET | Freq: Four times a day (QID) | ORAL | Status: AC | PRN

## 2014-01-27 MED ORDER — VITAMIN D (ERGOCALCIFEROL) 1.25 MG (50000 UNIT) PO CAPS: 50000.0000 [IU] | ORAL_CAPSULE | ORAL | Status: AC

## 2014-01-27 MED ORDER — FERROUS SULFATE 325 (65 FE) MG PO TABS: 325.0000 mg | ORAL_TABLET | Freq: Three times a day (TID) | ORAL | Status: AC

## 2014-01-27 MED ORDER — OXYCODONE HCL 5 MG PO TABS: 5.0000 mg | ORAL_TABLET | Freq: Four times a day (QID) | ORAL | Status: AC | PRN

## 2014-01-27 NOTE — Op Note (Signed)
NAMEbony Hail:  Trueheart, [name]                ACCOUNT NO.:  [id number]  MEDICAL RECORD NO.:  [id number]  LOCATION:  [id number]                        FACILITY:  [facility]  PHYSICIAN:  Doralee AlbinoMichael H. Carola FrostHandy, M.D. DATE OF BIRTH:  1950-10-17  DATE OF PROCEDURE:  01/26/2014 DATE OF DISCHARGE:                              OPERATIVE REPORT   PREOPERATIVE DIAGNOSIS:  Right three-part proximal humerus fracture.  POSTOPERATIVE DIAGNOSIS:  Right three-part proximal humerus fracture.  PROCEDURE:  Open reduction and internal fixation of right three-part proximal humerus.  SURGEON:  Doralee AlbinoMichael H. Carola FrostHandy, M.D.  ASSISTANT:  Montez MoritaKeith Paul, PA-C.  ANESTHESIA:  General supplemented with a regional block.  I/O:  1850 mL crystalloid/150 mL EBL.  DISPOSITION:  To PACU.  CONDITION:  Stable.  BRIEF SUMMARY AND INDICATION FOR PROCEDURE:  Lorraine Hammond is a 63 year old right-hand dominant female with a ground level fall resulting in a comminuted right proximal humerus fracture.  She was seen and evaluated urgently on referral from the ED.  I discussed with her and her husband the risks and benefits of surgical repair including the possibility of infection, nerve injury, vessel injury, DVT, PE, malunion, nonunion, loss of motion, need for further surgery, symptomatic hardware, and others.  We also specifically discussed avascular necrosis and the potential for conversion to arthroplasty.  The patient acknowledged these risks and did wish to proceed.  BRIEF SUMMARY OF PROCEDURE:  Lorraine Hammond was given preoperative antibiotics, taken to the operating room where general anesthesia was induced.  She was positioned supine on a radiolucent table.  Standard prep and drape was performed of the right upper extremity. Deltopectoral approach was made retracting the cephalic vein laterally with the deltoid.  Clavipectoral fascia was incised and then the bursa removed from the greater tuberosity as well as the lesser and remaining cuff.   The biceps tendon was intact.  This was used along with traction to restore appropriate rotational alignment.  A 2 mm K-wire was then inserted into the subchondral bone of the head segment and used as a joystick to bring the articular surface of the head out of varus and this combined with the use of a tamp was quite effective in restoring appropriate neck shaft angle to valgus and this was pinned provisionally through the shaft.  The lesser tuberosity fragment was secured with #2 FiberWire and two #2 FiberWires were used in the greater tuberosity fragment.  Plate was then applied and checked for placement with fluoro, then followed with definitive fixation using 5 proximal screws into the head and 3 into the shaft.  The FiberWire sutures were then used to secure the greater and lesser tuberosity fragments around the plate so that they would not rupture or cut the suture.  The entire construct moved well as an unit with excellent apposition.  It should be noted that after restoration of the neck shaft angle that the defect created by this was grafted aggressively with cancellous chips to provide additional medial structural support.  My assistant, Montez MoritaKeith Paul assisted throughout and his assistant was absolutely necessary in order to obtain and maintain reduction for provisional fixation as well as exchanging of the provisional fixation for definitive fixation because of  the muscle soft tissue forces involved.  He also assisted with wound closure.  The patient had excellent motion on the table following the repair.  PROGNOSIS:  Lorraine Hammond will be in a sling with passive range of motion of the shoulder including gentle rotation.  I plan to see her back in the office in 10-14 days and overnight stay tonight is anticipated for control of pain and nausea at this time.     Doralee AlbinoMichael H. Carola FrostHandy, M.D.     MHH/MEDQ  D:  01/26/2014  T:  01/27/2014  Job:  [id number]

## 2014-01-27 NOTE — Care Management Note (Signed)
CARE MANAGEMENT NOTE 01/27/2014  Patient:  Lorraine Hammond,Lorraine Hammond   Account Number:  [id number]  Date Initiated:  01/27/2014  Documentation initiated by:  Vance PeperBRADY,Linn Clavin  Subjective/Objective Assessment:   63 yr old female admitted s/p fall with right humerus fracture. Patient had a right humerus ORIF.     Action/Plan:   Case manager spoke with patient concerning home health needs. Choice offered.  Referral called to [facility]. Patient has family support at discharge.   Anticipated DC Date:  01/27/2014   Anticipated DC Plan:  HOME W HOME HEALTH SERVICES      DC Planning Services  CM consult      [facility]   Choice offered to / List presented to:  C-1 Patient   DME arranged  NA        HH arranged  HH-2 PT      HH agency  [facility]   Status of service:  Completed, signed off Medicare Important Message given?   (If response is "NO", the following Medicare IM given date fields will be blank) Date Medicare IM given:   Medicare IM given by:   Date Additional Medicare IM given:   Additional Medicare IM given by:    Discharge Disposition:  HOME W HOME HEALTH SERVICES  Per UR Regulation:  Reviewed for med. necessity/level of care/duration of stay

## 2014-01-27 NOTE — Plan of Care (Signed)
Problem: Consults Goal: Shoulder Surgery Patient Education See Patient Education Module for education specifics. Outcome: Completed/Met Date Met:  01/27/14 Goal: Diagnosis - Shoulder Surgery Total Shoulder Arthroplasty Right Goal: Skin Care Protocol Initiated - if Braden Score 18 or less If consults are not indicated, leave blank or document N/A Outcome: Completed/Met Date Met:  01/27/14 Goal: Nutrition Consult-if indicated Outcome: Completed/Met Date Met:  01/27/14 Goal: Diabetes Guidelines if Diabetic/Glucose > 140 If diabetic or lab glucose is > 140 mg/dl - Initiate Diabetes/Hyperglycemia Guidelines & Document Interventions  Outcome: Completed/Met Date Met:  01/27/14  Problem: Phase I Progression Outcomes Goal: OOB as tolerated unless otherwise ordered Outcome: Completed/Met Date Met:  01/27/14 Goal: Clear liquids, advance diet as tolerated Outcome: Completed/Met Date Met:  01/27/14 Goal: Post op CNS neurovascular WDL Outcome: Completed/Met Date Met:  01/27/14 Goal: Voiding-avoid urinary catheter unless indicated Outcome: Completed/Met Date Met:  01/27/14

## 2014-01-27 NOTE — Evaluation (Signed)
Occupational Therapy Evaluation Patient Details Name: Lorraine Hammond MRN: [id number] DOB: [birth year] Today's Date: 01/27/2014    History of Present Illness 63 y/o RHD white female sustained a ground level fall at her homeon 01/24/2014 after tripping over the dishwasher door. Pt hit her R shoulder resulting in a comminuted R proximal humerus fracture. Now s/p ORIF   Clinical Impression   Pt admitted with the above diagnoses and presents with below problem list. Pt will benefit from continued acute OT to address the below listed deficits and maximize independence with basic ADLs prior to d/c home. PTA pt was independent with ADLs. Pt currently needing min A for UB/LB clothing management. ADL and sling education provided. Exercises performed and educated on as detailed below. Recommending HHOT to reinforce ADL and sling education and to educate pt and caregiver on PROM of shoulder as pt unable to perform this date due to pain.    Follow Up Recommendations  Home health OT;Other (comment) (Supervision for OOB/mobility)    Equipment Recommendations  None recommended by OT    Recommendations for Other Services       Precautions / Restrictions Precautions Precautions: Other (comment) (No AROM of R shoulder) Precaution Comments: educated pt and boyfriend Required Braces or Orthoses: Sling Restrictions Weight Bearing Restrictions: Yes RUE Weight Bearing: Non weight bearing      Mobility Bed Mobility               General bed mobility comments: Pt in recliner.  Transfers Overall transfer level: Needs assistance   Transfers: Sit to/from Stand Sit to Stand: Supervision         General transfer comment: sit to stand from recliner and toilet    Balance Overall balance assessment: Needs assistance         Standing balance support: No upper extremity supported;During functional activity Standing balance-Leahy Scale: Good Standing balance comment: able to perform ADL tasks  with no LOB, needs occasional support of nearby surface for pendulums exercises. Educated pt on standing with a counter on her left side to perform pendulums and to complete dressing in front of chair. Educated pt and significant other on being beside pt while going up/down stairs.                            ADL Overall ADL's : Needs assistance/impaired Eating/Feeding: Set up;Sitting   Grooming: Set up;Sitting;Standing   Upper Body Bathing: Min guard;Sitting   Lower Body Bathing: Min guard;Sit to/from stand   Upper Body Dressing : Minimal assistance;Sitting   Lower Body Dressing: Minimal assistance;Sit to/from stand Lower Body Dressing Details (indicate cue type and reason): min A to finishing donning on right hip, discussed adaptive techniques Toilet Transfer: Supervision/safety;Ambulation   Toileting- Clothing Manipulation and Hygiene: Minimal assistance;Sit to/from stand Toileting - Clothing Manipulation Details (indicate cue type and reason): min A to finishing donning on right hip Tub/ Shower Transfer: Min guard;Ambulation;Shower seat   Functional mobility during ADLs: Supervision/safety General ADL Comments: Educated pt and significant other on techniques and AE for safe completion of ADLs during recovery. Discussed home setup and safety with toilet and shower transfers and navigating stairs. Pt completed UB/LB dressing, in-room ambulation, and toilet transfer as detailed above.      Vision                     Perception     Praxis      Pertinent Vitals/Pain Pain Assessment:  0-10 Pain Score: 8  Pain Location: RUE at surgical site Pain Descriptors / Indicators: Aching Pain Intervention(s): Limited activity within patient's tolerance;Monitored during session;Premedicated before session;Repositioned;Ice applied     Hand Dominance Right   Extremity/Trunk Assessment Upper Extremity Assessment Upper Extremity Assessment: RUE deficits/detail RUE  Deficits / Details: deficits as expected s/p R ORIF proximal humerus fracture RUE: Unable to fully assess due to immobilization   Lower Extremity Assessment Lower Extremity Assessment: Overall WFL for tasks assessed   Cervical / Trunk Assessment Cervical / Trunk Assessment: Normal   Communication Communication Communication: No difficulties   Cognition Arousal/Alertness: Awake/alert Behavior During Therapy: WFL for tasks assessed/performed Overall Cognitive Status: Within Functional Limits for tasks assessed                     General Comments       Exercises Exercises: Shoulder     Shoulder Instructions Shoulder Instructions Donning/doffing shirt without moving shoulder: Minimal assistance Method for sponge bathing under operated UE:  (educated) Donning/doffing sling/immobilizer: Minimal assistance Correct positioning of sling/immobilizer: Modified independent Pendulum exercises (written home exercise program): Supervision/safety ROM for elbow, wrist and digits of operated UE: Supervision/safety Sling wearing schedule (on at all times/off for ADL's):  (educated) Proper positioning of operated UE when showering:  (educated) Positioning of UE while sleeping:  (educated)    Home Living Family/patient expects to be discharged to:: Private residence Living Arrangements: Spouse/significant other Available Help at Discharge: Family;Available 24 hours/day Type of Home: House Home Access: Stairs to enter Entergy CorporationEntrance Stairs-Number of Steps: 2 Entrance Stairs-Rails: None Home Layout: Two level;1/2 bath on main level;Able to live on main level with bedroom/bathroom;Other (Comment) (shower is upstairs) Alternate Level Stairs-Number of Steps: flight to access 2nd level of house Alternate Level Stairs-Rails: Right Bathroom Shower/Tub: Chief Strategy OfficerTub/shower unit   Bathroom Toilet: Standard     Home Equipment: MudloggerAdaptive equipment Adaptive Equipment: Reacher        Prior  Functioning/Environment Level of Independence: Independent        Comments: works    OT Diagnosis: Acute pain   OT Problem List: Pain;Decreased range of motion;Decreased knowledge of precautions;Decreased knowledge of use of DME or AE;Impaired balance (sitting and/or standing)   OT Treatment/Interventions: Self-care/ADL training;Therapeutic exercise;DME and/or AE instruction;Therapeutic activities;Patient/family education;Balance training    OT Goals(Current goals can be found in the care plan section) Acute Rehab OT Goals Patient Stated Goal: go home, drive OT Goal Formulation: With patient/family Time For Goal Achievement: 02/03/14 Potential to Achieve Goals: Good ADL Goals Pt Will Perform Upper Body Dressing: with modified independence;with adaptive equipment;sitting Pt/caregiver will Perform Home Exercise Program: Right Upper extremity;With written HEP provided (with caregiver independent to assist.)  OT Frequency: Min 3X/week   Barriers to D/C:            Co-evaluation              End of Session Equipment Utilized During Treatment: Other (comment) (sling) Nurse Communication: Mobility status  Activity Tolerance: Patient tolerated treatment well;Patient limited by pain Patient left: in chair;with call bell/phone within reach;with family/visitor present   Time: [id number] OT Time Calculation (min): 35 min Charges:  OT General Charges $OT Visit: 1 Procedure OT Evaluation $Initial OT Evaluation Tier I: 1 Procedure OT Treatments $Self Care/Home Management : 8-22 mins $Therapeutic Exercise: 8-22 mins G-Codes: OT G-codes **NOT FOR INPATIENT CLASS** Functional Assessment Tool Used: clinical judgement Functional Limitation: Self care Self Care Current Status (V4098(G8987): At least 1 percent but less than 20  percent impaired, limited or restricted Self Care Goal Status (Z6109): At least 1 percent but less than 20 percent impaired, limited or restricted  Pilar Grammes 01/27/2014, 10:50 AM

## 2014-01-27 NOTE — Evaluation (Signed)
Physical Therapy Evaluation Patient Details Name: Lorraine Hammond MRN: [id number] DOB: 08/11/1950 Today's Date: 01/27/2014   History of Present Illness  63 y/o RHD white female sustained a ground level fall at her homeon 01/24/2014 after tripping over the dishwasher door. Pt hit her R shoulder resulting in a comminuted R proximal humerus fracture. Now s/p ORIF  Clinical Impression  Patient evaluated by Physical Therapy with no further acute PT needs identified. All education has been completed and the patient has no further questions.  See below for any follow-up Physical Therapy or equipment needs. PT is signing off. Thank you for this referral.     Follow Up Recommendations Home health PT (HHOT as well)  The potential need for Outpatient PT can be addressed at Ortho follow-up appointments.     Equipment Recommendations  None recommended by PT    Recommendations for Other Services OT consult     Precautions / Restrictions Precautions Precautions: Other (comment) (No AROM of R shoulder) Precaution Comments: educated pt and boyfriend Required Braces or Orthoses: Sling Restrictions Weight Bearing Restrictions: Yes RUE Weight Bearing: Non weight bearing      Mobility  Bed Mobility               General bed mobility comments: Pt in recliner. (has been managing NWBing R shoulder at home since [place] Simultaneous filing. User may not have seen previous data.)  Transfers Overall transfer level: Needs assistance Equipment used: None Transfers: Sit to/from Stand Sit to Stand: Supervision         General transfer comment: sit to stand from recliner  (Simultaneous filing. User may not have seen previous data.)  Ambulation/Gait Ambulation/Gait assistance: Supervision Ambulation Distance (Feet): 100 Feet Assistive device: None (and pushing IV pole) Gait Pattern/deviations: Decreased stride length Gait velocity: slowed   General Gait Details: Cues to self-monitor for activity  tolerance  Stairs Stairs: Yes Stairs assistance: Min guard Stair Management: One rail Right;Sideways Number of Stairs: 4 General stair comments: verbal and demo cues for technique  Wheelchair Mobility    Modified Rankin (Stroke Patients Only)       Balance Overall balance assessment: Needs assistance         Standing balance support: No upper extremity supported;During functional activity Standing balance-Leahy Scale: Good Standing balance comment: able to perform ADL tasks with no LOB, needs occasional support of nearby surface for pendulums exercises. Educated pt on standing with a counter on her left side to perform pendulums and to complete dressing in front of chair. Educated pt and significant other on being beside pt while going up/down stairs.                             Pertinent Vitals/Pain Pain Assessment: 0-10 Pain Score: 7  Pain Location: RUE at surgical site Pain Descriptors / Indicators: Aching Pain Intervention(s): Limited activity within patient's tolerance;Monitored during session;Repositioned    Home Living Family/patient expects to be discharged to:: Private residence Living Arrangements: Spouse/significant other Available Help at Discharge: Family;Available 24 hours/day Type of Home: House Home Access: Stairs to enter Entrance Stairs-Rails: None Entrance Stairs-Number of Steps: 2 Home Layout: Two level;1/2 bath on main level;Able to live on main level with bedroom/bathroom;Other (Comment) (shower is upstairs) Home Equipment: Adaptive equipment      Prior Function Level of Independence: Independent         Comments: works     Engineer, manufacturing Dominance   Dominant Hand: Right  Extremity/Trunk Assessment   Upper Extremity Assessment: Defer to OT evaluation RUE Deficits / Details: deficits as expected s/p R ORIF proximal humerus fracture RUE: Unable to fully assess due to immobilization       Lower Extremity Assessment: Overall  WFL for tasks assessed      Cervical / Trunk Assessment: Normal  Communication   Communication: No difficulties  Cognition Arousal/Alertness: Awake/alert Behavior During Therapy: WFL for tasks assessed/performed Overall Cognitive Status: Within Functional Limits for tasks assessed                      General Comments      Exercises      Assessment/Plan    PT Assessment All further PT needs can be met in the next venue of care  PT Diagnosis Difficulty walking   PT Problem List Decreased strength;Decreased range of motion;Decreased activity tolerance;Decreased balance;Decreased mobility;Pain;Decreased knowledge of precautions  PT Treatment Interventions     PT Goals (Current goals can be found in the Care Plan section) Acute Rehab PT Goals Patient Stated Goal: go home, drive PT Goal Formulation: All assessment and education complete, DC therapy    Frequency     Barriers to discharge        Co-evaluation               End of Session Equipment Utilized During Treatment:  (sling) Activity Tolerance: Patient tolerated treatment well Patient left: in chair;with call bell/phone within reach;with family/visitor present Nurse Communication: Mobility status    Functional Assessment Tool Used: Clinical Judgement Functional Limitation: Mobility: Walking and moving around Mobility: Walking and Moving Around Current Status [phone]): 0 percent impaired, limited or restricted Mobility: Walking and Moving Around Goal Status [phone]): 0 percent impaired, limited or restricted Mobility: Walking and Moving Around Discharge Status [phone]): 0 percent impaired, limited or restricted    Time: [id number] PT Time Calculation (min) (ACUTE ONLY): 12 min   Charges:   PT Evaluation $Initial PT Evaluation Tier I: 1 Procedure     PT G Codes:   Functional Assessment Tool Used: Clinical Judgement Functional Limitation: Mobility: Walking and moving around    Brimhall Nizhoni 01/27/2014, 12:24 PM  Roney Marion, [place] Pager [phone] Office [phone]

## 2014-01-27 NOTE — Discharge Summary (Signed)
[facility] (OTS)  Patient ID: Lorraine Hammond MRN: [id number] DOB/AGE: 10/09/1950 63 y.o.  Admit date: 01/26/2014 Discharge date: 01/27/2014  Admission Diagnoses: Right proximal humerus fracture GERD  Discharge Diagnoses:  Active Problems:   Proximal humerus fracture, right   Vitamin D deficiency   Osteoporosis   Iron deficiency   GERD (gastroesophageal reflux disease)   Procedures Performed: 01/26/2014- Dr. Carola Frost  1. Open reduction and internal fixation of right three-part proximal humerus   Discharged Condition: good  Hospital Course:   Patient is a 63 year old right-hand-dominant white female who sustained a ground-level fall on 01/24/2014 after tripping over an open dishwasher door at home. Patient sustained a comminuted right proximal humerus fracture. She was seen at [facility] and referred to our office as an outpatient. She was seen in the office on 01/25/2014 and evaluated at that point. Her pattern was deemed to be surgical. We discussed risks and benefits with the patient and I discussed that surgery would be her best option for a full functional recovery. Patient was in agreement with the plan and was taken to the operating room on 01/26/2014 with the procedure described up above was performed. After surgery patient was transferred to the PACU for recovery from anesthesia and admitted overnight for observation, pain control and to begin occupational therapy. Patient did have a regional block placed for additional pain control. On postoperative day #1 patient was doing very well without any significant issues. She was able to get up and ambulate without any difficulty. Pain was controlled with her block as well as oral pain medication once her block wore off. She worked with physical therapy and occupational therapy on postoperative day #1 as well and did very well from this standpoint. Patient was able to void without any problems after Foley  removal. And on postoperative day #1 she was deemed to be stable for discharge to home.   Given her low energy mechanism for injury we did obtain some basic metabolic Bone disease labs which were notable for significant vitamin D deficiency, iron deficiency without anemia and a mildly elevated TSH. her PTH level is pending at the time of this dictation. Given these findings patient will be supplemented with vitamin D2 50,000 IUs weekly for the next 8 weeks as well as ferrous sulfate 325 mg po every 8 hours with meals for the next 3 months. We will recheck her labs specifically her vitamin D at the 8 week mark and proceed accordingly. If we don't see sufficient rise and we will obtain additional labs. She does have a history of Crohn's disease in her mother but I don't think that she has a malabsorption issue as her vitamin B-12 and other nutritional parameters were okay. Patient will need an outpatient DEXA scan in the next 3-4 weeks to quantitatively evaluate her bone density. We did calculate her fracture score and she is at a 17% risk for 10 year probability of a major osteoporotic fracture and a 3% risk for 10 year probability of hip fracture.    Patient does not have a primary care physician at this time and we have discussed the importance of her finding 1 at discharge for long-term monitoring of her osteoporosis, vitamin D deficiency and iron deficiency  Consults: None  Significant Diagnostic Studies: labs:   Results for Lorraine Hammond, Lorraine Hammond (MRN [id number]) as of 01/27/2014 09:09   Ref. Range  01/27/2014 06:11   Sodium  Latest Range: 137-147 mEq/L  139  Potassium  Latest Range: 3.7-5.3 mEq/L  4.4   Chloride  Latest Range: 96-112 mEq/L  101   CO2  Latest Range: 19-32 mEq/L  26   BUN  Latest Range: 6-23 mg/dL  11   Creatinine  Latest Range: 0.50-1.10 mg/dL  1.610.68   Calcium  Latest Range: 8.4-10.5 mg/dL  8.8   GFR calc non Af Amer  Latest Range: >90 mL/min  >90   GFR calc Af Amer  Latest Range: >90  mL/min  >90   Glucose  Latest Range: 70-99 mg/dL  096105 (H)   Anion gap  Latest Range: 5-15   12   WBC  Latest Range: 4.0-10.5 K/uL  9.6   RBC  Latest Range: 3.87-5.11 MIL/uL  3.79 (L)   Hemoglobin  Latest Range: 12.0-15.0 g/dL  04.510.5 (L)   HCT  Latest Range: 36.0-46.0 %  33.1 (L)   MCV  Latest Range: 78.0-100.0 fL  87.3   MCH  Latest Range: 26.0-34.0 pg  27.7   MCHC  Latest Range: 30.0-36.0 g/dL  40.931.7   RDW  Latest Range: 11.5-15.5 %  13.3   Platelets  Latest Range: 150-400 K/uL  274     Results for Lorraine Hammond, Lorraine Hammond (MRN [id number]) as of 01/27/2014 09:09   Ref. Range  01/26/2014 10:34   Iron  Latest Range: 42-135 ug/dL  37 (L)   UIBC  Latest Range: 125-400 ug/dL  956310   TIBC  Latest Range: 250-470 ug/dL  213347   Saturation Ratios  Latest Range: 20-55 %  11 (L)   Ferritin  Latest Range: 10-291 ng/mL  48   Transferrin  Latest Range: 200-360 mg/dL  086298   Folate  No range found  >20.0   Vit D, 25-Hydroxy  Latest Range: 30-89 ng/mL  12 (L)   Vitamin B-12  Latest Range: 211-911 pg/mL  350   Results for Lorraine Hammond, Lorraine Hammond (MRN [id number]) as of 01/27/2014 09:09   Ref. Range  01/26/2014 10:35   TSH  Latest Range: 0.350-4.500 uIU/mL  4.830 (H)    Intact PTH: pending   Treatments: IV hydration, antibiotics: Ancef, analgesia: acetaminophen, Morphine and oxycodone, Percocet, anticoagulation: LMW heparin, therapies: PT, OT and RN and surgery: as above  Discharge Exam:  [facility]uma Service Progress Note  Subjective  Doing ok Pain controlled Has been out of bed already, no new issues Ready to go home    Review of Systems  Constitutional: Negative for fever and chills.  Eyes: Negative for blurred vision.  Respiratory: Negative for shortness of breath and wheezing.   Cardiovascular: Negative for chest pain, palpitations and orthopnea.  Gastrointestinal: Negative for nausea, vomiting and abdominal pain.  Genitourinary: Negative for dysuria and urgency.  Neurological: Negative for  tingling, sensory change and headaches.     Objective   BP 127/64 mmHg  Pulse 86  Temp(Src) 99.1 F (37.3 C) (Oral)  Resp 19  Ht 5\' 3"  (1.6 m)  Wt 86.183 kg (190 lb)  BMI 33.67 kg/m2  SpO2 95%  Intake/Output       11/10 0701 - 11/11 0700 11/11 0701 - 11/12 0700    P.O. 480     I.V. (mL/kg) 2172.5 (25.2)     IV Piggyback 100     Total Intake(mL/kg) 2752.5 (31.9)     Urine (mL/kg/hr) 400     Blood 200     Total Output 600      Net +2152.5            Urine Occurrence  1 x       Labs Results for Lorraine Hammond, Lorraine Hammond (MRN [id number]) as of 01/27/2014 09:09   Ref. Range  01/27/2014 06:11   Sodium  Latest Range: 137-147 mEq/L  139   Potassium  Latest Range: 3.7-5.3 mEq/L  4.4   Chloride  Latest Range: 96-112 mEq/L  101   CO2  Latest Range: 19-32 mEq/L  26   BUN  Latest Range: 6-23 mg/dL  11   Creatinine  Latest Range: 0.50-1.10 mg/dL  4.09   Calcium  Latest Range: 8.4-10.5 mg/dL  8.8   GFR calc non Af Amer  Latest Range: >90 mL/min  >90   GFR calc Af Amer  Latest Range: >90 mL/min  >90   Glucose  Latest Range: 70-99 mg/dL  811 (H)   Anion gap  Latest Range: 5-15   12   WBC  Latest Range: 4.0-10.5 K/uL  9.6   RBC  Latest Range: 3.87-5.11 MIL/uL  3.79 (L)   Hemoglobin  Latest Range: 12.0-15.0 g/dL  91.4 (L)   HCT  Latest Range: 36.0-46.0 %  33.1 (L)   MCV  Latest Range: 78.0-100.0 fL  87.3   MCH  Latest Range: 26.0-34.0 pg  27.7   MCHC  Latest Range: 30.0-36.0 g/dL  78.2   RDW  Latest Range: 11.5-15.5 %  13.3   Platelets  Latest Range: 150-400 K/uL  274     Results for Lorraine Hammond, Lorraine Hammond (MRN [id number]) as of 01/27/2014 09:09   Ref. Range  01/26/2014 10:34   Iron  Latest Range: 42-135 ug/dL  37 (L)   UIBC  Latest Range: 125-400 ug/dL  578   TIBC  Latest Range: 250-470 ug/dL  469   Saturation Ratios  Latest Range: 20-55 %  11 (L)   Ferritin  Latest Range: 10-291 ng/mL  48   Transferrin  Latest Range: 200-360 mg/dL  629   Folate  No range found  >20.0   Vit D, 25-Hydroxy   Latest Range: 30-89 ng/mL  12 (L)   Vitamin B-12  Latest Range: 211-911 pg/mL  350   Results for Lorraine Hammond, Lorraine Hammond (MRN [id number]) as of 01/27/2014 09:09   Ref. Range  01/26/2014 10:35   TSH  Latest Range: 0.350-4.500 uIU/mL  4.830 (H)      Exam  Gen: awake and alert, resting comfortably in bed, NAD Lungs: clear   Cardiac: RRR, s1 and s2 Abd: + BS, NTND Ext:        Right Upper Extremity               Sling in place             Dressing c/d/i             R/U/M/Ax motor and sensory functions intact             Ext arm             + radial pulse             Good elbow, forearm, wrist motion             Swelling controlled     Assessment and Plan   POD/HD#: 1   63 y/o RHD white female s/p ground level fall with R proximal humerus fracture  1. R proximal humerus fracture s/p ORIF             Sling for comfort             OT- gentle shoulder  PROM and pendulums, gentle IR and ER R shoulder                         AROM/PROM R elbow, forearm, wrist and hand             Ice prn             Dressing changes as needed               Ok to remove ace                2. Pain management:             perococet             Oxy IR             Robaxin  3. ABL anemia/Hemodynamics             CBC stable                4. Iron deficiency               Iron supplementation                5. DVT/PE prophylaxis:             No pharmacologics at dc needed  6. ID:               Completed periop abx  7. 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 Activity:              per #1  9. Impediments to fracture healing:             Vitamin d deficiency               Osteoporosis   10. Dispo:             Dc home today             Follow up in 10-14 days     Disposition: 01-Home or Self Care      Discharge Instructions    Call MD / Call 911    Complete by:  As directed   If you experience chest pain or shortness of breath, CALL 911 and be transported to the hospital emergency room.  If you develope a fever above  101 F, pus (white drainage) or increased drainage or redness at the wound, or calf pain, call your surgeon's office.     Constipation Prevention    Complete by:  As directed   Drink plenty of fluids.  Prune juice may be helpful.  You may use a stool softener, such as Colace (over the counter) 100 mg twice a day.  Use MiraLax (over the counter) for constipation as needed.     Diet general    Complete by:  As directed      Discharge instructions    Complete by:  As directed   [facility] Discharge Instructions   General Discharge Instructions  WEIGHT BEARING STATUS: Nonweightbearing Right upper extremity   RANGE OF MOTION/ACTIVITY: Right Shoulder pendulums, gentle Passive FF and ABD and gentle should IR/ER, NO ACTIVE SHOULDER MOTION  Active and Passive elbow, forearm, wrist and hand motion as tolerated   Diet: as you were eating previously.  Can use over the counter stool softeners and bowel preparations, such as Miralax, to help with bowel movements.  Narcotics can be constipating.  Be sure to drink plenty of fluids  STOP SMOKING OR USING NICOTINE PRODUCTS!!!!  As discussed nicotine severely impairs your body's ability to heal surgical and traumatic wounds but also impairs bone healing.  Wounds and bone heal by forming microscopic blood vessels (angiogenesis) and nicotine is a vasoconstrictor (essentially, shrinks blood  vessels).  Therefore, if vasoconstriction occurs to these microscopic blood vessels they essentially disappear and are unable to deliver necessary nutrients to the healing tissue.  This is one modifiable factor that you can do to dramatically increase your chances of healing your injury.    (This means no smoking, no nicotine gum, patches, etc)  DO NOT USE NONSTEROIDAL ANTI-INFLAMMATORY DRUGS (NSAID'S)  Using products such as Advil (ibuprofen), Aleve (naproxen), Motrin (ibuprofen) for additional pain control during fracture healing can delay and/or prevent the healing response.  If you would like to take over the counter (OTC) medication, Tylenol (acetaminophen) is ok.  However, some narcotic medications that are given for pain control contain acetaminophen as well. Therefore, you should not exceed more than 4000 mg of tylenol in a day if you do not have liver disease.  Also note that there are may OTC medicines, such as cold medicines and allergy medicines that my contain tylenol as well.  If you have any questions about medications and/or interactions please ask your doctor/PA or your pharmacist.   PAIN MEDICATION USE AND EXPECTATIONS  You have likely been given narcotic medications to help control your pain.  After a traumatic event that results in an fracture (broken bone) with or without surgery, it is ok to use narcotic pain medications to help control one's pain.  We understand that everyone responds to pain differently and each individual patient will be evaluated on a regular basis for the continued need for narcotic medications. Ideally, narcotic medication use should last no more than 6-8 weeks (coinciding with fracture healing).   As a patient it is your responsibility as well to monitor narcotic medication use and report the amount and frequency you use these medications when you come to your office visit.   We would also advise that if you are using narcotic medications, you should take a dose  prior to therapy to maximize you participation.  IF YOU ARE ON NARCOTIC MEDICATIONS IT IS NOT PERMISSIBLE TO OPERATE A MOTOR VEHICLE (MOTORCYCLE/CAR/TRUCK/MOPED) OR HEAVY MACHINERY DO NOT MIX NARCOTICS WITH OTHER CNS (  CENTRAL NERVOUS SYSTEM) DEPRESSANTS SUCH AS ALCOHOL       ICE AND ELEVATE INJURED/OPERATIVE EXTREMITY  Using ice and elevating the injured extremity above your heart can help with swelling and pain control.  Icing in a pulsatile fashion, such as 20 minutes on and 20 minutes off, can be followed.    Do not place ice directly on skin. Make sure there is a barrier between to skin and the ice pack.    Using frozen items such as frozen peas works well as the conform nicely to the are that needs to be iced.  USE AN ACE WRAP OR TED HOSE FOR SWELLING CONTROL  In addition to icing and elevation, Ace wraps or TED hose are used to help limit and resolve swelling.  It is recommended to use Ace wraps or TED hose until you are informed to stop.    When using Ace Wraps start the wrapping distally (farthest away from the body) and wrap proximally (closer to the body)   Example: If you had surgery on your leg or thing and you do not have a splint on, start the ace wrap at the toes and work your way up to the thigh        If you had surgery on your upper extremity and do not have a splint on, start the ace wrap at your fingers and work your way up to the upper arm  IF YOU ARE IN A SPLINT OR CAST DO NOT REMOVE IT FOR ANY REASON   If your splint gets wet for any reason please contact the office immediately. You may shower in your splint or cast as long as you keep it dry.  This can be done by wrapping in a cast cover or garbage back (or similar)  Do Not stick any thing down your splint or cast such as pencils, money, or hangers to try and scratch yourself with.  If you feel itchy take benadryl as prescribed on the bottle for itching  IF YOU ARE IN A CAM BOOT (BLACK BOOT)  You may remove boot  periodically. Perform daily dressing changes as noted below.  Wash the liner of the boot regularly and wear a sock when wearing the boot. It is recommended that you sleep in the boot until told otherwise  CALL THE OFFICE WITH ANY QUESTIONS OR CONCERTS: [phone]     Discharge Pin Site Instructions  Dress pins daily with Kerlix roll starting on POD 2. Wrap the Kerlix so that it tamps the skin down around the pin-skin interface to prevent/limit motion of the skin relative to the pin.  (Pin-skin motion is the primary cause of pain and infection related to external fixator pin sites).  Remove any crust or coagulum that may obstruct drainage with a saline moistened gauze or soap and water.  After POD 3, if there is no discernable drainage on the pin site dressing, the interval for change can by increased to every other day.  You may shower with the fixator, cleaning all pin sites gently with soap and water.  If you have a surgical wound this needs to be completely dry and without drainage before showering.  The extremity can be lifted by the fixator to facilitate wound care and transfers.  Notify the office/Doctor if you experience increasing drainage, redness, or pain from a pin site, or if you notice purulent (thick, snot-like) drainage.  Discharge Wound Care Instructions  Do NOT apply any ointments, solutions or lotions to pin  sites or surgical wounds.  These prevent needed drainage and even though solutions like hydrogen peroxide kill bacteria, they also damage cells lining the pin sites that help fight infection.  Applying lotions or ointments can keep the wounds moist and can cause them to breakdown and open up as well. This can increase the risk for infection. When in doubt call the office.  Surgical incisions should be dressed daily.  If any drainage is noted, use one layer of adaptic, then gauze, Kerlix, and an ace wrap.  Once the incision is completely dry and without drainage, it  may be left open to air out.  Showering may begin 36-48 hours later.  Cleaning gently with soap and water.  Traumatic wounds should be dressed daily as well.    One layer of adaptic, gauze, Kerlix, then ace wrap.  The adaptic can be discontinued once the draining has ceased    If you have a wet to dry dressing: wet the gauze with saline the squeeze as much saline out so the gauze is moist (not soaking wet), place moistened gauze over wound, then place a dry gauze over the moist one, followed by Kerlix wrap, then ace wrap.     Driving restrictions    Complete by:  As directed   No driving     Increase activity slowly as tolerated    Complete by:  As directed      Lifting restrictions    Complete by:  As directed   No lifting     Non weight bearing    Complete by:  As directed   Laterality:  right  Extremity:  Upper            Medication List    TAKE these medications        DSS 100 MG Caps  Take 100 mg by mouth 2 (two) times daily.     esomeprazole 20 MG capsule  Commonly known as:  NEXIUM  Take 20 mg by mouth daily at 12 noon.     ferrous sulfate 325 (65 FE) MG tablet  Take 1 tablet (325 mg total) by mouth 3 (three) times daily with meals.     fexofenadine 180 MG tablet  Commonly known as:  ALLEGRA  Take 180 mg by mouth daily as needed for allergies or rhinitis.     methocarbamol 500 MG tablet  Commonly known as:  ROBAXIN  Take 1-2 tablets (500-1,000 mg total) by mouth every 6 (six) hours as needed for muscle spasms.     multivitamin with minerals Tabs tablet  Take 1 tablet by mouth daily.     oxyCODONE 5 MG immediate release tablet  Commonly known as:  Oxy IR/ROXICODONE  Take 1-2 tablets (5-10 mg total) by mouth every 6 (six) hours as needed for breakthrough pain (take between percocet doses for breakthrough pain as needed).     oxyCODONE-acetaminophen 5-325 MG per tablet  Commonly known as:  PERCOCET/ROXICET  Take 1-2 tablets by mouth every 6 (six) hours as  needed for moderate pain.     Vitamin D (Ergocalciferol) 50000 UNITS Caps capsule  Commonly known as:  DRISDOL  Take 1 capsule (50,000 Units total) by mouth every 7 (seven) days.       Follow-up Information    Follow up with HANDY,MICHAEL H, MD. Schedule an appointment as soon as possible for a visit in 2 weeks.   Specialty:  Orthopedic Surgery   Why:  For suture removal, For wound re-check  Contact information:   [address] [place] [address] [phone]       Discharge Instructions and Plan:  Patient has sustained a fairly significant injury to her dominant arm. We were able to achieve excellent result with open reduction and internal fixation. Patient will be nonweightbearing on her right arm for the next 8 weeks She will begin gentle pendulums and passive motion of her shoulder at this time. She is permitted to perform gentle rotation of her shoulder as well Active and passive range of motion of her right elbow, forearm, wrist and hand We will refer the patient outpatient occupational therapy at her first postoperative visit Dressing changes as needed Okay for the patient to shower Sling is to remain on during mobilization but may be off when in bed or sitting in a chair Would continue with ice and elevation as needed Okay to remove her Ace wrap and apply as needed for swelling  Even though we had a technically successful surgery patient is still at risk for nonunion or malunion I given the presence of some metabolic bone deficiencies. I will attempt to correct these with simple intervention such as supplementation particularly her vitamin D. Her nutrition appears to be adequate based on additional labs. And we will continue to monitor this closely. Patient will need an outpatient DEXA scan with the next 3-4 weeks. She is borderline in terms of being a candidate for pharmacologic osteoporosis treatment and I would be more inclined to treat her I given the  presence of this low energy proximal humerus fracture. In the interim we will continue with vitamin D, multivitamin, iron supplementation and make any adjustments based on her DEXA scan  Patient will also seek PCP for long-term monitoring  Patient should contact the office with any questions or concerns. We'll check the patient back in 2 weeks for reevaluation, follow-up x-rays and removal of her sutures  Signed:  Mearl Latin, PA-C [facility] [phone] (P) 01/27/2014, 10:08 AM

## 2014-01-27 NOTE — Progress Notes (Signed)
[facility] Progress Note  Subjective  Doing ok Pain controlled Has been out of bed already, no new issues Ready to go home    Review of Systems  Constitutional: Negative for fever and chills.  Eyes: Negative for blurred vision.  Respiratory: Negative for shortness of breath and wheezing.   Cardiovascular: Negative for chest pain, palpitations and orthopnea.  Gastrointestinal: Negative for nausea, vomiting and abdominal pain.  Genitourinary: Negative for dysuria and urgency.  Neurological: Negative for tingling, sensory change and headaches.     Objective   BP 127/64 mmHg  Pulse 86  Temp(Src) 99.1 F (37.3 C) (Oral)  Resp 19  Ht 5\' 3"  (1.6 m)  Wt 86.183 kg (190 lb)  BMI 33.67 kg/m2  SpO2 95%  Intake/Output      11/10 0701 - 11/11 0700 11/11 0701 - 11/12 0700   P.O. 480    I.V. (mL/kg) 2172.5 (25.2)    IV Piggyback 100    Total Intake(mL/kg) 2752.5 (31.9)    Urine (mL/kg/hr) 400    Blood 200    Total Output 600     Net +2152.5          Urine Occurrence 1 x      Labs Results for Lorraine Hammond, Lorraine Hammond (MRN [id number]) as of 01/27/2014 09:09  Ref. Range 01/27/2014 06:11  Sodium Latest Range: 137-147 mEq/L 139  Potassium Latest Range: 3.7-5.3 mEq/L 4.4  Chloride Latest Range: 96-112 mEq/L 101  CO2 Latest Range: 19-32 mEq/L 26  BUN Latest Range: 6-23 mg/dL 11  Creatinine Latest Range: 0.50-1.10 mg/dL 7.820.68  Calcium Latest Range: 8.4-10.5 mg/dL 8.8  GFR calc non Af Amer Latest Range: >90 mL/min >90  GFR calc Af Amer Latest Range: >90 mL/min >90  Glucose Latest Range: 70-99 mg/dL 956105 (H)  Anion gap Latest Range: 5-15  12  WBC Latest Range: 4.0-10.5 K/uL 9.6  RBC Latest Range: 3.87-5.11 MIL/uL 3.79 (L)  Hemoglobin Latest Range: 12.0-15.0 g/dL 21.310.5 (L)  HCT Latest Range: 36.0-46.0 % 33.1 (L)  MCV Latest Range: 78.0-100.0 fL 87.3  MCH Latest Range: 26.0-34.0 pg 27.7  MCHC Latest Range: 30.0-36.0 g/dL 08.631.7  RDW Latest Range: 11.5-15.5 % 13.3  Platelets  Latest Range: 150-400 K/uL 274    Results for Lorraine Hammond, Lorraine Hammond (MRN [id number]) as of 01/27/2014 09:09  Ref. Range 01/26/2014 10:34  Iron Latest Range: 42-135 ug/dL 37 (L)  UIBC Latest Range: 125-400 ug/dL 528310  TIBC Latest Range: 250-470 ug/dL 413347  Saturation Ratios Latest Range: 20-55 % 11 (L)  Ferritin Latest Range: 10-291 ng/mL 48  Transferrin Latest Range: 200-360 mg/dL 244298  Folate No range found >20.0  Vit D, 25-Hydroxy Latest Range: 30-89 ng/mL 12 (L)  Vitamin B-12 Latest Range: 211-911 pg/mL 350  Results for Lorraine Hammond, Lorraine Hammond (MRN [id number]) as of 01/27/2014 09:09  Ref. Range 01/26/2014 10:35  TSH Latest Range: 0.350-4.500 uIU/mL 4.830 (H)     Exam  Gen: awake and alert, resting comfortably in bed, NAD Lungs: clear  Cardiac: RRR, s1 and s2 Abd: + BS, NTND Ext:       Right Upper Extremity   Sling in place  Dressing c/d/i  R/U/M/Ax motor and sensory functions intact  Ext arm  + radial pulse  Good elbow, forearm, wrist motion  Swelling controlled     Assessment and Plan   POD/HD#: 1   63 y/o RHD white female s/p ground level fall with R proximal humerus fracture  1. R proximal humerus fracture s/p ORIF  Sling for comfort  OT- gentle shoulder PROM and pendulums, gentle IR and ER R shoulder   AROM/PROM R elbow, forearm, wrist and hand  Ice prn  Dressing changes as needed   Ok to remove ace    2. Pain management:  perococet  Oxy IR  Robaxin  3. ABL anemia/Hemodynamics  CBC stable    4. Iron deficiency   Iron supplementation    5. DVT/PE prophylaxis:  No pharmacologics at dc needed  6. ID:   Completed periop abx  7. 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 Activity:   per #1  9. Impediments to fracture healing:  Vitamin d deficiency   Osteoporosis   10. Dispo:  Dc home today  Follow up in 10-14 days     Mearl LatinKeith W. Myrick Mcnairy, PA-C [facility] [phone](P) [phone] (O) 01/27/2014 9:03 AM

## 2014-01-27 NOTE — Discharge Instructions (Signed)
[facility] Discharge Instructions   General Discharge Instructions  WEIGHT BEARING STATUS: Nonweightbearing Right upper extremity   RANGE OF MOTION/ACTIVITY: Right Shoulder pendulums, gentle Passive FF and ABD and gentle should IR/ER, NO ACTIVE SHOULDER MOTION  Active and Passive elbow, forearm, wrist and hand motion as tolerated   Diet: as you were eating previously.  Can use over the counter stool softeners and bowel preparations, such as Miralax, to help with bowel movements.  Narcotics can be constipating.  Be sure to drink plenty of fluids  STOP SMOKING OR USING NICOTINE PRODUCTS!!!!  As discussed nicotine severely impairs your body's ability to heal surgical and traumatic wounds but also impairs bone healing.  Wounds and bone heal by forming microscopic blood vessels (angiogenesis) and nicotine is a vasoconstrictor (essentially, shrinks blood vessels).  Therefore, if vasoconstriction occurs to these microscopic blood vessels they essentially disappear and are unable to deliver necessary nutrients to the healing tissue.  This is one modifiable factor that you can do to dramatically increase your chances of healing your injury.    (This means no smoking, no nicotine gum, patches, etc)  DO NOT USE NONSTEROIDAL ANTI-INFLAMMATORY DRUGS (NSAID'S)  Using products such as Advil (ibuprofen), Aleve (naproxen), Motrin (ibuprofen) for additional pain control during fracture healing can delay and/or prevent the healing response.  If you would like to take over the counter (OTC) medication, Tylenol (acetaminophen) is ok.  However, some narcotic medications that are given for pain control contain acetaminophen as well. Therefore, you should not exceed more than 4000 mg of tylenol in a day if you do not have liver disease.  Also note that there are may OTC medicines, such as cold medicines and allergy medicines that my contain tylenol as well.  If you have any questions about medications  and/or interactions please ask your doctor/PA or your pharmacist.   PAIN MEDICATION USE AND EXPECTATIONS  You have likely been given narcotic medications to help control your pain.  After a traumatic event that results in an fracture (broken bone) with or without surgery, it is ok to use narcotic pain medications to help control one's pain.  We understand that everyone responds to pain differently and each individual patient will be evaluated on a regular basis for the continued need for narcotic medications. Ideally, narcotic medication use should last no more than 6-8 weeks (coinciding with fracture healing).   As a patient it is your responsibility as well to monitor narcotic medication use and report the amount and frequency you use these medications when you come to your office visit.   We would also advise that if you are using narcotic medications, you should take a dose prior to therapy to maximize you participation.  IF YOU ARE ON NARCOTIC MEDICATIONS IT IS NOT PERMISSIBLE TO OPERATE A MOTOR VEHICLE (MOTORCYCLE/CAR/TRUCK/MOPED) OR HEAVY MACHINERY DO NOT MIX NARCOTICS WITH OTHER CNS (CENTRAL NERVOUS SYSTEM) DEPRESSANTS SUCH AS ALCOHOL       ICE AND ELEVATE INJURED/OPERATIVE EXTREMITY  Using ice and elevating the injured extremity above your heart can help with swelling and pain control.  Icing in a pulsatile fashion, such as 20 minutes on and 20 minutes off, can be followed.    Do not place ice directly on skin. Make sure there is a barrier between to skin and the ice pack.    Using frozen items such as frozen peas works well as the conform nicely to the are that needs to be iced.  USE AN ACE WRAP  OR TED HOSE FOR SWELLING CONTROL  In addition to icing and elevation, Ace wraps or TED hose are used to help limit and resolve swelling.  It is recommended to use Ace wraps or TED hose until you are informed to stop.    When using Ace Wraps start the wrapping distally (farthest away from the  body) and wrap proximally (closer to the body)   Example: If you had surgery on your leg or thing and you do not have a splint on, start the ace wrap at the toes and work your way up to the thigh        If you had surgery on your upper extremity and do not have a splint on, start the ace wrap at your fingers and work your way up to the upper arm  IF YOU ARE IN A SPLINT OR CAST DO NOT REMOVE IT FOR ANY REASON   If your splint gets wet for any reason please contact the office immediately. You may shower in your splint or cast as long as you keep it dry.  This can be done by wrapping in a cast cover or garbage back (or similar)  Do Not stick any thing down your splint or cast such as pencils, money, or hangers to try and scratch yourself with.  If you feel itchy take benadryl as prescribed on the bottle for itching  IF YOU ARE IN A CAM BOOT (BLACK BOOT)  You may remove boot periodically. Perform daily dressing changes as noted below.  Wash the liner of the boot regularly and wear a sock when wearing the boot. It is recommended that you sleep in the boot until told otherwise  CALL THE OFFICE WITH ANY QUESTIONS OR CONCERTS: [phone]     Discharge Pin Site Instructions  Dress pins daily with Kerlix roll starting on POD 2. Wrap the Kerlix so that it tamps the skin down around the pin-skin interface to prevent/limit motion of the skin relative to the pin.  (Pin-skin motion is the primary cause of pain and infection related to external fixator pin sites).  Remove any crust or coagulum that may obstruct drainage with a saline moistened gauze or soap and water.  After POD 3, if there is no discernable drainage on the pin site dressing, the interval for change can by increased to every other day.  You may shower with the fixator, cleaning all pin sites gently with soap and water.  If you have a surgical wound this needs to be completely dry and without drainage before showering.  The extremity can  be lifted by the fixator to facilitate wound care and transfers.  Notify the office/Doctor if you experience increasing drainage, redness, or pain from a pin site, or if you notice purulent (thick, snot-like) drainage.  Discharge Wound Care Instructions  Do NOT apply any ointments, solutions or lotions to pin sites or surgical wounds.  These prevent needed drainage and even though solutions like hydrogen peroxide kill bacteria, they also damage cells lining the pin sites that help fight infection.  Applying lotions or ointments can keep the wounds moist and can cause them to breakdown and open up as well. This can increase the risk for infection. When in doubt call the office.  Surgical incisions should be dressed daily.  If any drainage is noted, use one layer of adaptic, then gauze, Kerlix, and an ace wrap.  Once the incision is completely dry and without drainage, it may be left open to  air out.  Showering may begin 36-48 hours later.  Cleaning gently with soap and water.  Traumatic wounds should be dressed daily as well.    One layer of adaptic, gauze, Kerlix, then ace wrap.  The adaptic can be discontinued once the draining has ceased    If you have a wet to dry dressing: wet the gauze with saline the squeeze as much saline out so the gauze is moist (not soaking wet), place moistened gauze over wound, then place a dry gauze over the moist one, followed by Kerlix wrap, then ace wrap.

## 2014-01-27 NOTE — Plan of Care (Signed)
Problem: Phase I Progression Outcomes Goal: Pain controlled with appropriate interventions Outcome: Completed/Met Date Met:  01/27/14 Goal: OT evaluation for ADLs discussed Outcome: Completed/Met Date Met:  01/27/14 Goal: Therapeutic exercise per MD order Outcome: Completed/Met Date Met:  01/27/14 Goal: Discharge plan established Outcome: Completed/Met Date Met:  01/27/14 Goal: Hemodynamically stable Outcome: Completed/Met Date Met:  01/27/14 Goal: Other Phase I Outcomes/Goals Outcome: Not Applicable Date Met:  59/09/31  Problem: Phase II Progression Outcomes Goal: Barriers To Progression Addressed/Resolved Outcome: Completed/Met Date Met:  01/27/14 Goal: Discharge plan in place and appropriate Outcome: Completed/Met Date Met:  01/27/14 Goal: Pain controlled with appropriate interventions Outcome: Completed/Met Date Met:  01/27/14 Goal: Hemodynamically stable Outcome: Completed/Met Date Met:  03/04/23 Goal: Complications resolved/controlled Outcome: Completed/Met Date Met:  01/27/14 Goal: Tolerating diet Outcome: Completed/Met Date Met:  01/27/14 Goal: Activity appropriate for discharge plan Outcome: Completed/Met Date Met:  01/27/14 Goal: Incision without S/S infection Outcome: Completed/Met Date Met:  01/27/14 Goal: Other Discharge Outcomes/Goals Outcome: Not Applicable Date Met:  46/95/07

## 2014-01-30 LAB — VITAMIN D 1,25 DIHYDROXY
VITAMIN D3 1, 25 (OH): 63 pg/mL
Vitamin D 1, 25 (OH)2 Total: 63 pg/mL (ref 18–72)
Vitamin D2 1, 25 (OH)2: <8 pg/mL

## 2015-05-27 ENCOUNTER — Other Ambulatory Visit: Payer: Self-pay

## 2015-05-27 DIAGNOSIS — Z1231 Encounter for screening mammogram for malignant neoplasm of breast: Secondary | ICD-10-CM

## 2015-06-06 ENCOUNTER — Ambulatory Visit
Admission: RE | Admit: 2015-06-06 | Discharge: 2015-06-06 | Disposition: A | Discharge status: Home / Self Care | Payer: No Typology Code available for payment source | Source: Ambulatory Visit

## 2015-06-06 DIAGNOSIS — Z1231 Encounter for screening mammogram for malignant neoplasm of breast: Secondary | ICD-10-CM

## 2015-06-08 ENCOUNTER — Other Ambulatory Visit: Payer: Self-pay | Admitting: Internal Medicine

## 2015-06-08 DIAGNOSIS — R928 Other abnormal and inconclusive findings on diagnostic imaging of breast: Secondary | ICD-10-CM

## 2015-06-13 ENCOUNTER — Other Ambulatory Visit: Payer: Self-pay | Admitting: Internal Medicine

## 2015-06-13 ENCOUNTER — Ambulatory Visit
Admission: RE | Admit: 2015-06-13 | Discharge: 2015-06-13 | Disposition: A | Discharge status: Home / Self Care | Payer: No Typology Code available for payment source | Source: Ambulatory Visit | Attending: Internal Medicine | Admitting: Internal Medicine

## 2015-06-13 DIAGNOSIS — R928 Other abnormal and inconclusive findings on diagnostic imaging of breast: Secondary | ICD-10-CM

## 2015-06-24 ENCOUNTER — Ambulatory Visit
Admission: RE | Admit: 2015-06-24 | Discharge: 2015-06-24 | Disposition: A | Discharge status: Home / Self Care | Payer: No Typology Code available for payment source | Source: Ambulatory Visit | Attending: Internal Medicine | Admitting: Internal Medicine

## 2015-06-24 DIAGNOSIS — R928 Other abnormal and inconclusive findings on diagnostic imaging of breast: Secondary | ICD-10-CM

## 2015-06-24 HISTORY — PX: BREAST BIOPSY: SHX20

## 2016-07-30 ENCOUNTER — Other Ambulatory Visit: Payer: Self-pay | Admitting: Internal Medicine

## 2016-07-30 DIAGNOSIS — Z1231 Encounter for screening mammogram for malignant neoplasm of breast: Secondary | ICD-10-CM

## 2016-08-21 ENCOUNTER — Ambulatory Visit
Admission: RE | Admit: 2016-08-21 | Discharge: 2016-08-21 | Disposition: A | Discharge status: Home / Self Care | Payer: No Typology Code available for payment source | Source: Ambulatory Visit | Attending: Internal Medicine | Admitting: Internal Medicine

## 2016-08-21 DIAGNOSIS — Z1231 Encounter for screening mammogram for malignant neoplasm of breast: Secondary | ICD-10-CM

## 2016-12-20 IMAGING — MG MM DIAGNOSTIC UNILATERAL L
1 series · 1 of 1 positions shown · non-contrast
Comparison: Baseline screening mammogram dated [DATE].

CLINICAL DATA: Patient was called back from screening mammogram for
left breast calcifications.

EXAM:
DIGITAL DIAGNOSTIC LEFT MAMMOGRAM WITH CAD

[L ML]
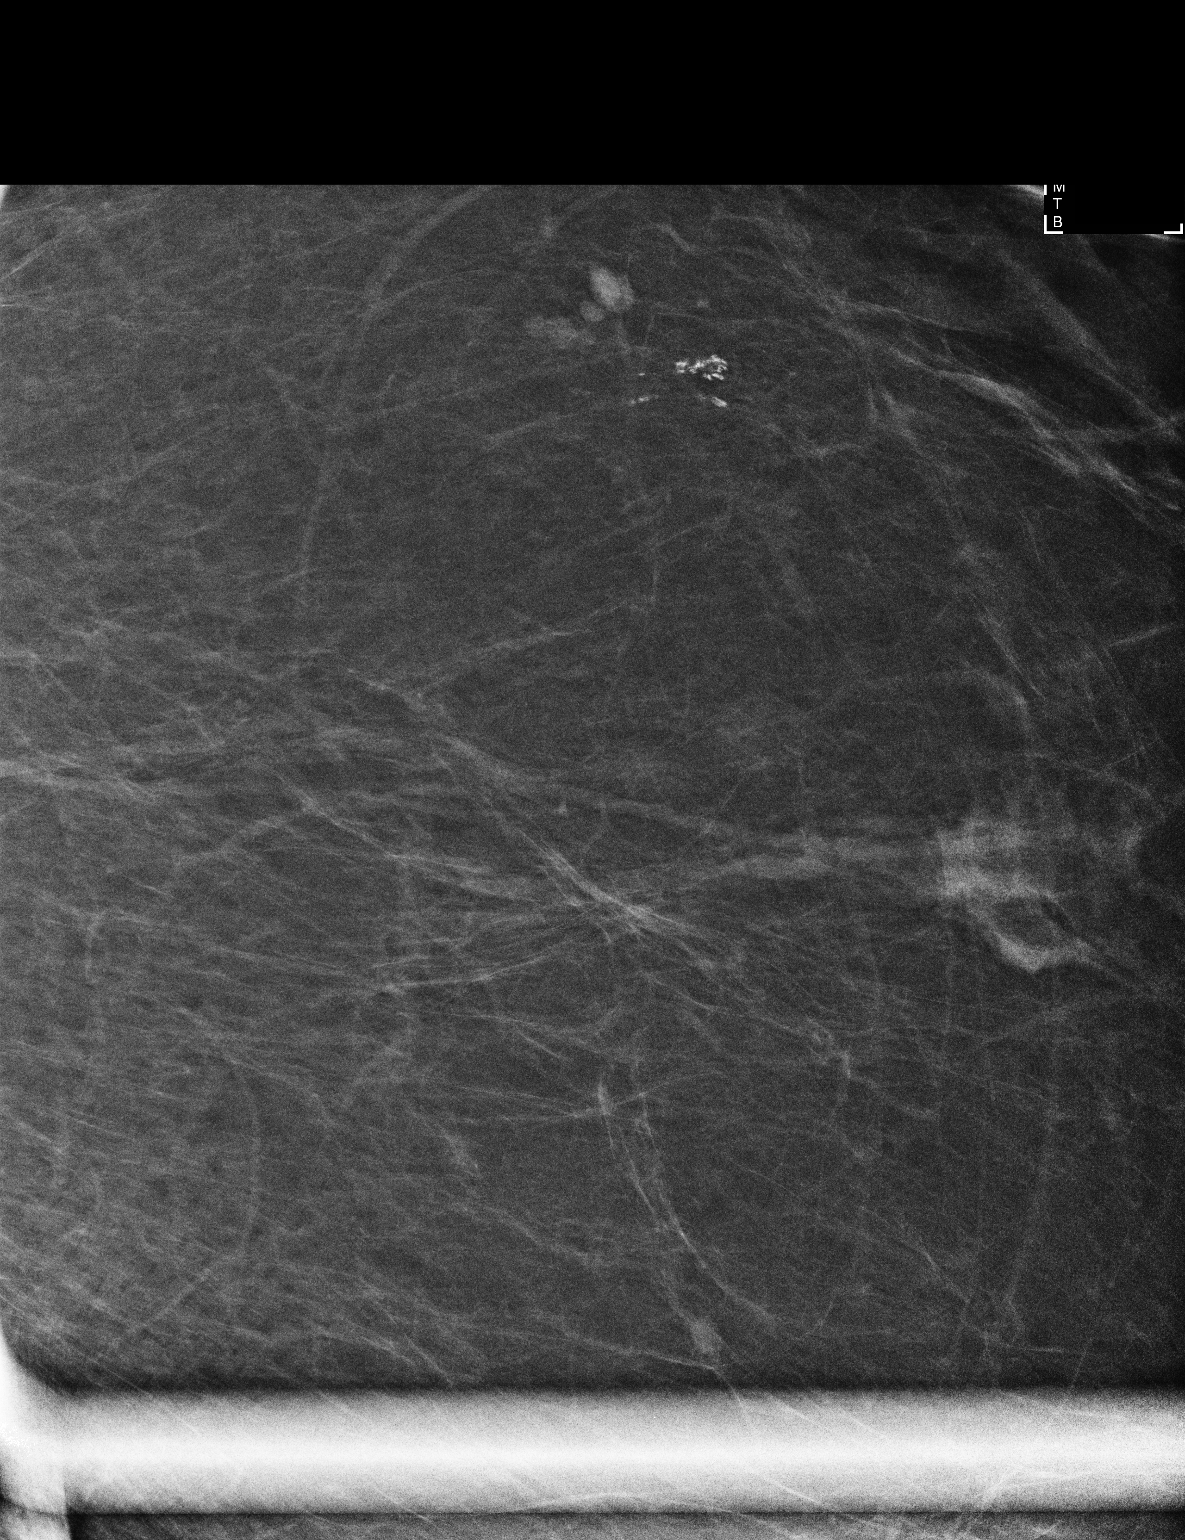

[1 of 1 positions shown; findings below may reference images not displayed]

ACR Breast Density Category b: There are scattered areas of
fibroglandular density.
FINDINGS: Additional imaging of the left breast was performed. Grouped coarse
heterogeneous calcifications spanning an area of 1.3 cm are seen in
the upper-outer quadrant of the left breast. They are indeterminate.

Mammographic images were processed with CAD.
IMPRESSION: Indeterminate calcifications in the upper-outer quadrant of the left
breast.

RECOMMENDATION:
Stereotactic biopsy of the left breast calcifications is
recommended. This will be scheduled the patient's convenience.

I have discussed the findings and recommendations with the patient.
Results were also provided in writing at the conclusion of the
visit. If applicable, a reminder letter will be sent to the patient
regarding the next appointment.

BI-RADS CATEGORY  4: Suspicious abnormality - biopsy should be
considered.

## 2018-04-02 ENCOUNTER — Other Ambulatory Visit: Payer: Self-pay | Admitting: Internal Medicine

## 2018-04-02 ENCOUNTER — Ambulatory Visit
Admission: RE | Admit: 2018-04-02 | Discharge: 2018-04-02 | Disposition: A | Discharge status: Home / Self Care | Payer: PRIVATE HEALTH INSURANCE | Source: Ambulatory Visit | Attending: Internal Medicine | Admitting: Internal Medicine

## 2018-04-02 DIAGNOSIS — Z1231 Encounter for screening mammogram for malignant neoplasm of breast: Secondary | ICD-10-CM

## 2019-03-23 ENCOUNTER — Other Ambulatory Visit: Payer: Self-pay | Admitting: Internal Medicine

## 2019-03-23 DIAGNOSIS — Z1231 Encounter for screening mammogram for malignant neoplasm of breast: Secondary | ICD-10-CM

## 2019-05-01 ENCOUNTER — Other Ambulatory Visit: Payer: Self-pay

## 2019-05-01 ENCOUNTER — Ambulatory Visit
Admission: RE | Admit: 2019-05-01 | Discharge: 2019-05-01 | Disposition: A | Discharge status: Home / Self Care | Payer: PRIVATE HEALTH INSURANCE | Source: Ambulatory Visit | Attending: Internal Medicine | Admitting: Internal Medicine

## 2019-05-01 DIAGNOSIS — Z1231 Encounter for screening mammogram for malignant neoplasm of breast: Secondary | ICD-10-CM

## 2020-04-08 ENCOUNTER — Other Ambulatory Visit: Payer: Self-pay | Admitting: Internal Medicine

## 2020-04-08 DIAGNOSIS — Z1231 Encounter for screening mammogram for malignant neoplasm of breast: Secondary | ICD-10-CM

## 2020-04-28 DIAGNOSIS — Z1231 Encounter for screening mammogram for malignant neoplasm of breast: Secondary | ICD-10-CM

## 2020-05-20 ENCOUNTER — Ambulatory Visit
Admission: RE | Admit: 2020-05-20 | Discharge: 2020-05-20 | Disposition: A | Discharge status: Home / Self Care | Payer: PRIVATE HEALTH INSURANCE | Source: Ambulatory Visit | Attending: Internal Medicine | Admitting: Internal Medicine

## 2020-05-20 ENCOUNTER — Other Ambulatory Visit: Payer: Self-pay

## 2020-05-20 DIAGNOSIS — Z1231 Encounter for screening mammogram for malignant neoplasm of breast: Secondary | ICD-10-CM

## 2021-04-17 ENCOUNTER — Other Ambulatory Visit: Payer: Self-pay | Admitting: Family Medicine

## 2021-04-17 DIAGNOSIS — Z1231 Encounter for screening mammogram for malignant neoplasm of breast: Secondary | ICD-10-CM

## 2021-05-23 ENCOUNTER — Ambulatory Visit
Admission: RE | Admit: 2021-05-23 | Discharge: 2021-05-23 | Disposition: A | Discharge status: Home / Self Care | Payer: No Typology Code available for payment source | Source: Ambulatory Visit | Attending: Family Medicine | Admitting: Family Medicine

## 2021-05-23 DIAGNOSIS — Z1231 Encounter for screening mammogram for malignant neoplasm of breast: Secondary | ICD-10-CM

## 2022-05-29 ENCOUNTER — Other Ambulatory Visit: Payer: Self-pay | Admitting: Family Medicine

## 2022-05-29 DIAGNOSIS — Z1231 Encounter for screening mammogram for malignant neoplasm of breast: Secondary | ICD-10-CM

## 2022-07-17 ENCOUNTER — Ambulatory Visit
Admission: RE | Admit: 2022-07-17 | Discharge: 2022-07-17 | Disposition: A | Discharge status: Home / Self Care | Payer: No Typology Code available for payment source | Source: Ambulatory Visit | Attending: Family Medicine | Admitting: Family Medicine

## 2022-07-17 DIAGNOSIS — Z1231 Encounter for screening mammogram for malignant neoplasm of breast: Secondary | ICD-10-CM

## 2022-08-28 ENCOUNTER — Other Ambulatory Visit (HOSPITAL_BASED_OUTPATIENT_CLINIC_OR_DEPARTMENT_OTHER): Payer: Self-pay

## 2022-08-28 DIAGNOSIS — E785 Hyperlipidemia, unspecified: Secondary | ICD-10-CM

## 2022-09-18 ENCOUNTER — Ambulatory Visit (HOSPITAL_BASED_OUTPATIENT_CLINIC_OR_DEPARTMENT_OTHER)
Admission: RE | Admit: 2022-09-18 | Discharge: 2022-09-18 | Disposition: A | Discharge status: Home / Self Care | Payer: No Typology Code available for payment source | Source: Ambulatory Visit

## 2022-09-18 DIAGNOSIS — E785 Hyperlipidemia, unspecified: Secondary | ICD-10-CM | POA: Insufficient documentation

## 2022-11-30 IMAGING — MG MM DIGITAL SCREENING BILAT W/ TOMO AND CAD
8 series · 8 of 24 positions shown · non-contrast
Comparison: Previous exam(s).

CLINICAL DATA: Screening.

EXAM:
DIGITAL SCREENING BILATERAL MAMMOGRAM WITH TOMOSYNTHESIS AND CAD
TECHNIQUE: Bilateral screening digital craniocaudal and mediolateral oblique
mammograms were obtained. Bilateral screening digital breast
tomosynthesis was performed. The images were evaluated with
computer-aided detection.

[L CC synth-2D]
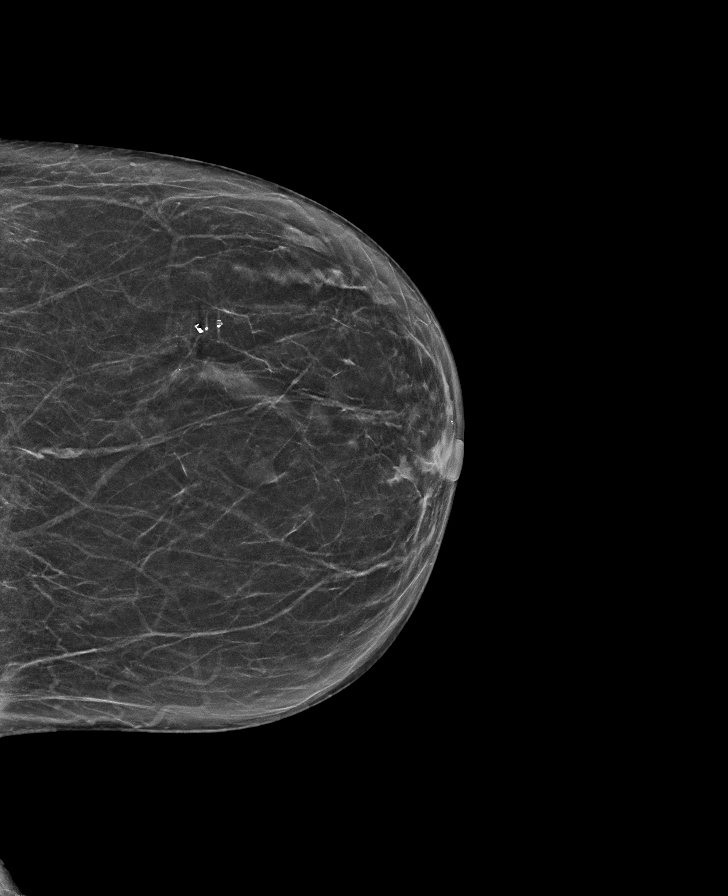

[L MLO synth-2D]
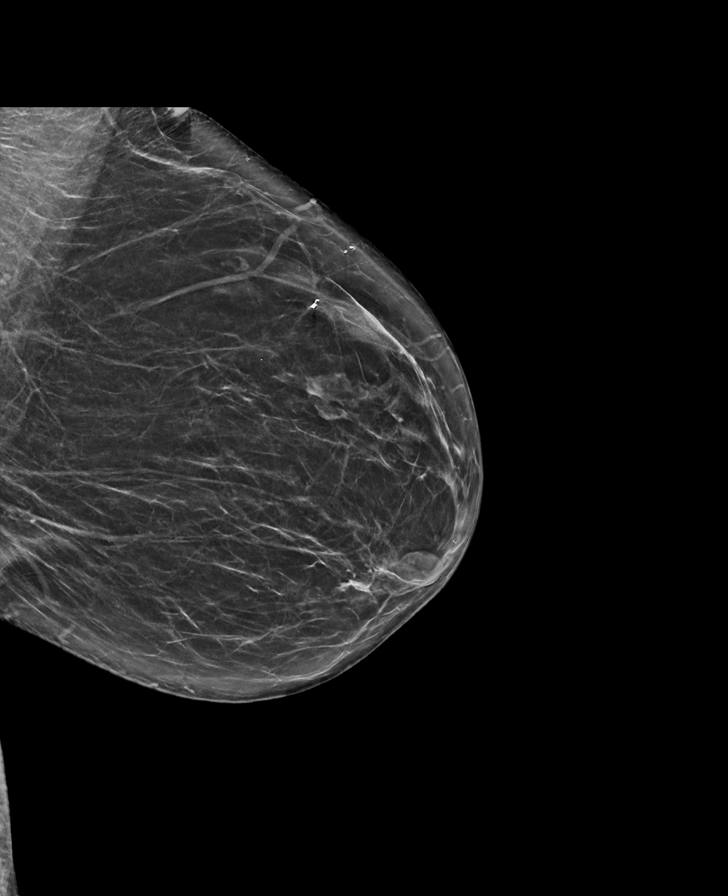

[R CC synth-2D]
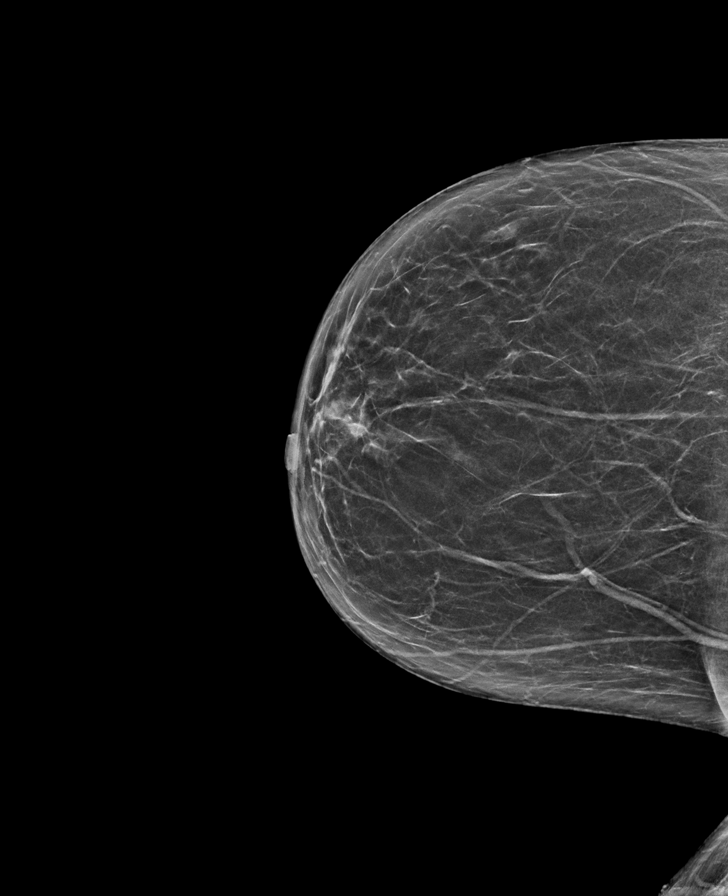

[R MLO synth-2D]
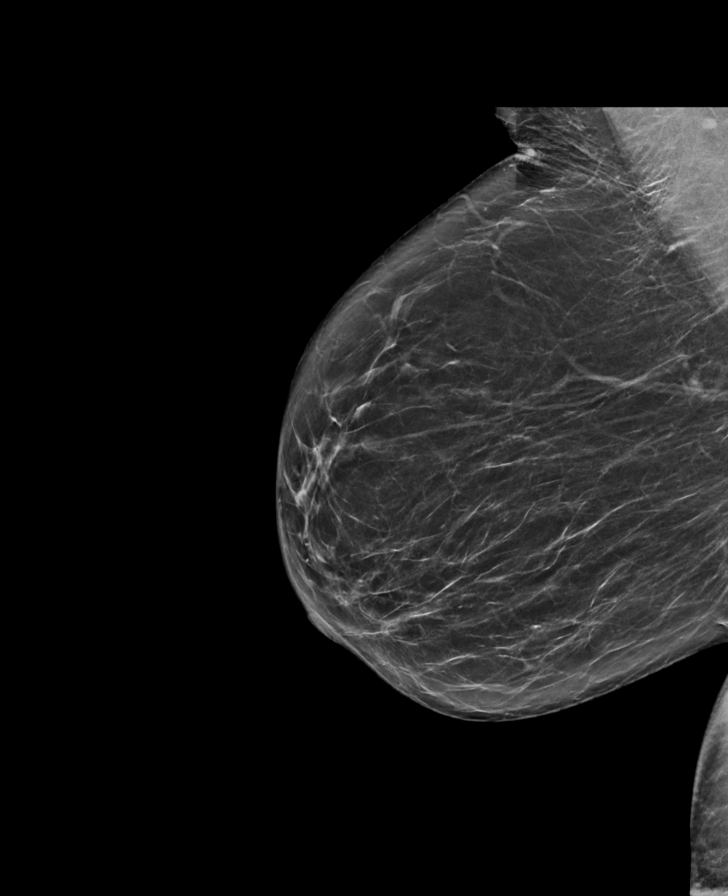

[L CC tomo · tomo slice 29/58.0]
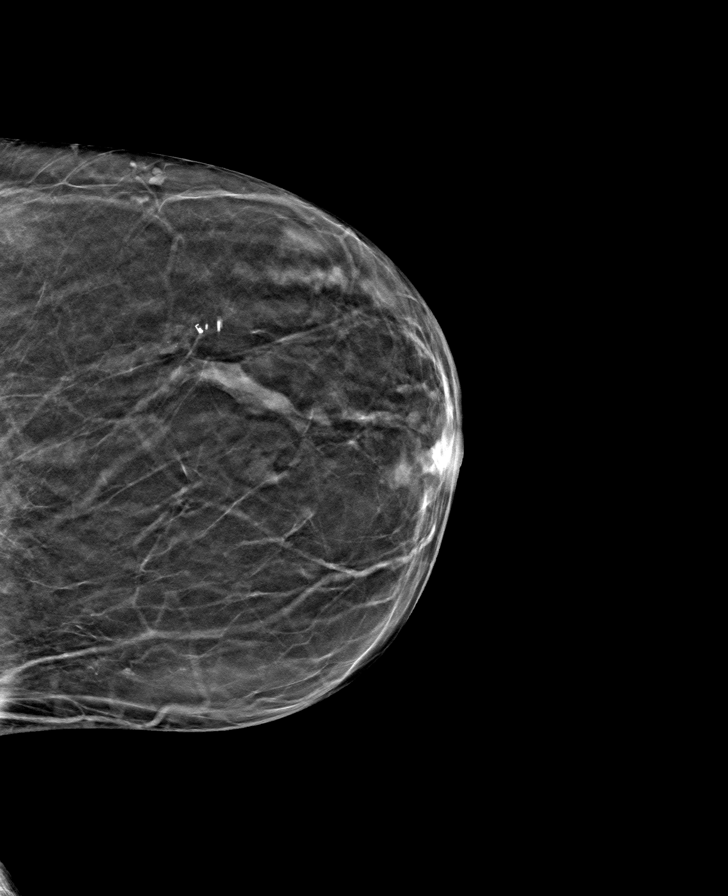

[R MLO tomo · tomo slice 33/66.0]
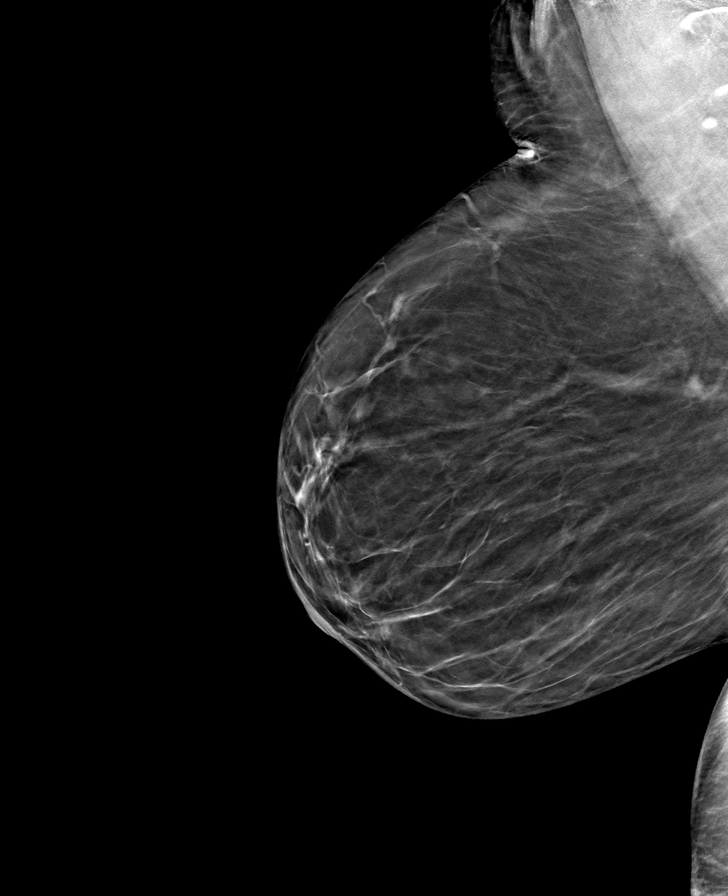

[R CC tomo · tomo slice 30/59.0]
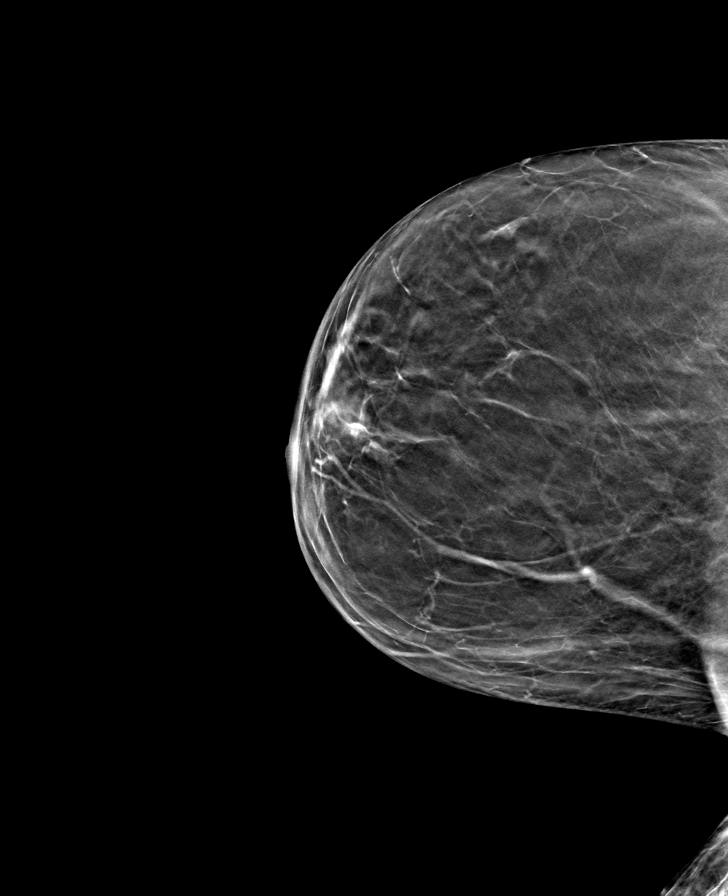

[L MLO tomo · tomo slice 33/65.0]
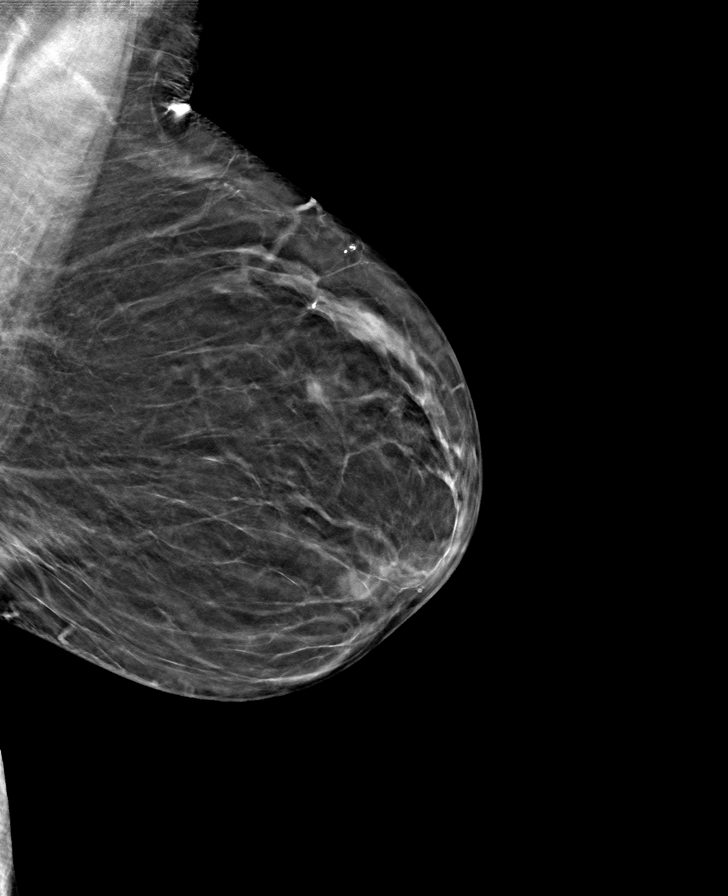

[8 of 24 positions shown; findings below may reference images not displayed]

ACR Breast Density Category b: There are scattered areas of
fibroglandular density.
FINDINGS: There are no findings suspicious for malignancy.
IMPRESSION: No mammographic evidence of malignancy. A result letter of this
screening mammogram will be mailed directly to the patient.

RECOMMENDATION:
Screening mammogram in one year. (Code:51-O-LD2)

BI-RADS CATEGORY  1: Negative.

## 2023-06-20 ENCOUNTER — Other Ambulatory Visit: Payer: Self-pay

## 2023-06-20 DIAGNOSIS — Z1231 Encounter for screening mammogram for malignant neoplasm of breast: Secondary | ICD-10-CM

## 2023-07-18 ENCOUNTER — Ambulatory Visit
Admission: RE | Admit: 2023-07-18 | Discharge: 2023-07-18 | Disposition: A | Discharge status: Home / Self Care | Source: Ambulatory Visit

## 2023-07-18 DIAGNOSIS — Z1231 Encounter for screening mammogram for malignant neoplasm of breast: Secondary | ICD-10-CM

## 2023-08-13 DIAGNOSIS — E785 Hyperlipidemia, unspecified: Secondary | ICD-10-CM | POA: Diagnosis not present

## 2023-08-13 DIAGNOSIS — E559 Vitamin D deficiency, unspecified: Secondary | ICD-10-CM | POA: Diagnosis not present

## 2023-08-13 DIAGNOSIS — R7301 Impaired fasting glucose: Secondary | ICD-10-CM | POA: Diagnosis not present

## 2023-08-19 DIAGNOSIS — E559 Vitamin D deficiency, unspecified: Secondary | ICD-10-CM | POA: Diagnosis not present

## 2023-08-19 DIAGNOSIS — E785 Hyperlipidemia, unspecified: Secondary | ICD-10-CM | POA: Diagnosis not present

## 2023-08-19 DIAGNOSIS — F418 Other specified anxiety disorders: Secondary | ICD-10-CM | POA: Diagnosis not present

## 2023-08-19 DIAGNOSIS — Z0000 Encounter for general adult medical examination without abnormal findings: Secondary | ICD-10-CM | POA: Diagnosis not present

## 2023-08-19 DIAGNOSIS — J4520 Mild intermittent asthma, uncomplicated: Secondary | ICD-10-CM | POA: Diagnosis not present

## 2023-08-19 DIAGNOSIS — M810 Age-related osteoporosis without current pathological fracture: Secondary | ICD-10-CM | POA: Diagnosis not present

## 2023-10-16 DIAGNOSIS — H2513 Age-related nuclear cataract, bilateral: Secondary | ICD-10-CM | POA: Diagnosis not present

## 2023-10-16 DIAGNOSIS — H524 Presbyopia: Secondary | ICD-10-CM | POA: Diagnosis not present

## 2023-10-16 DIAGNOSIS — D3121 Benign neoplasm of right retina: Secondary | ICD-10-CM | POA: Diagnosis not present

## 2023-10-16 DIAGNOSIS — D3122 Benign neoplasm of left retina: Secondary | ICD-10-CM | POA: Diagnosis not present

## 2024-02-24 DIAGNOSIS — R7301 Impaired fasting glucose: Secondary | ICD-10-CM | POA: Diagnosis not present

## 2024-02-24 DIAGNOSIS — M810 Age-related osteoporosis without current pathological fracture: Secondary | ICD-10-CM | POA: Diagnosis not present

## 2024-02-24 DIAGNOSIS — E785 Hyperlipidemia, unspecified: Secondary | ICD-10-CM | POA: Diagnosis not present

## 2024-03-02 DIAGNOSIS — F418 Other specified anxiety disorders: Secondary | ICD-10-CM | POA: Diagnosis not present

## 2024-03-02 DIAGNOSIS — E785 Hyperlipidemia, unspecified: Secondary | ICD-10-CM | POA: Diagnosis not present

## 2024-03-02 DIAGNOSIS — M25561 Pain in right knee: Secondary | ICD-10-CM | POA: Diagnosis not present

## 2024-03-02 DIAGNOSIS — M5441 Lumbago with sciatica, right side: Secondary | ICD-10-CM | POA: Diagnosis not present

## 2024-03-02 DIAGNOSIS — M25562 Pain in left knee: Secondary | ICD-10-CM | POA: Diagnosis not present

## 2024-03-02 DIAGNOSIS — M810 Age-related osteoporosis without current pathological fracture: Secondary | ICD-10-CM | POA: Diagnosis not present

## 2024-03-02 DIAGNOSIS — J4520 Mild intermittent asthma, uncomplicated: Secondary | ICD-10-CM | POA: Diagnosis not present
